# Patient Record
Sex: Female | Born: 1970 | Race: White | Hispanic: No | Marital: Single | Smoking: Never smoker
Health system: Southern US, Community
[De-identification: ages and names within clinical notes are randomized; demographics above are authoritative.]

## PROBLEM LIST (undated history)

## (undated) DIAGNOSIS — I10 Essential (primary) hypertension: Secondary | ICD-10-CM

## (undated) HISTORY — PX: HERNIA REPAIR: SHX51

---

## 1997-12-26 ENCOUNTER — Other Ambulatory Visit: Admission: RE | Admit: 1997-12-26 | Discharge: 1997-12-26 | Payer: Self-pay | Admitting: Obstetrics and Gynecology

## 1999-04-13 ENCOUNTER — Other Ambulatory Visit: Admission: RE | Admit: 1999-04-13 | Discharge: 1999-04-13 | Payer: Self-pay | Admitting: Obstetrics and Gynecology

## 1999-12-01 ENCOUNTER — Other Ambulatory Visit: Admission: RE | Admit: 1999-12-01 | Discharge: 1999-12-01 | Payer: Self-pay | Admitting: Obstetrics and Gynecology

## 2001-01-05 ENCOUNTER — Other Ambulatory Visit: Admission: RE | Admit: 2001-01-05 | Discharge: 2001-01-05 | Payer: Self-pay | Admitting: Obstetrics and Gynecology

## 2001-04-03 ENCOUNTER — Other Ambulatory Visit: Admission: RE | Admit: 2001-04-03 | Discharge: 2001-04-03 | Payer: Self-pay | Admitting: Gynecology

## 2002-06-19 ENCOUNTER — Other Ambulatory Visit: Admission: RE | Admit: 2002-06-19 | Discharge: 2002-06-19 | Payer: Self-pay | Admitting: Gynecology

## 2002-06-25 ENCOUNTER — Encounter: Admission: RE | Admit: 2002-06-25 | Discharge: 2002-09-23 | Payer: Self-pay | Admitting: Gynecology

## 2002-07-03 ENCOUNTER — Encounter (INDEPENDENT_AMBULATORY_CARE_PROVIDER_SITE_OTHER): Payer: Self-pay | Admitting: *Deleted

## 2002-07-03 ENCOUNTER — Ambulatory Visit (HOSPITAL_COMMUNITY): Admission: RE | Admit: 2002-07-03 | Discharge: 2002-07-03 | Payer: Self-pay | Admitting: Gynecology

## 2003-02-26 ENCOUNTER — Encounter: Admission: RE | Admit: 2003-02-26 | Discharge: 2003-05-27 | Payer: Self-pay | Admitting: Gynecology

## 2003-06-06 ENCOUNTER — Encounter (INDEPENDENT_AMBULATORY_CARE_PROVIDER_SITE_OTHER): Payer: Self-pay

## 2003-06-06 ENCOUNTER — Inpatient Hospital Stay (HOSPITAL_COMMUNITY): Admission: AD | Admit: 2003-06-06 | Discharge: 2003-06-09 | Payer: Self-pay | Admitting: Gynecology

## 2003-06-10 ENCOUNTER — Encounter: Admission: RE | Admit: 2003-06-10 | Discharge: 2003-07-10 | Payer: Self-pay | Admitting: Gynecology

## 2003-07-11 ENCOUNTER — Encounter: Admission: RE | Admit: 2003-07-11 | Discharge: 2003-08-10 | Payer: Self-pay | Admitting: Gynecology

## 2003-07-21 ENCOUNTER — Other Ambulatory Visit: Admission: RE | Admit: 2003-07-21 | Discharge: 2003-07-21 | Payer: Self-pay | Admitting: Gynecology

## 2003-09-10 ENCOUNTER — Encounter: Admission: RE | Admit: 2003-09-10 | Discharge: 2003-10-10 | Payer: Self-pay | Admitting: Gynecology

## 2003-11-28 ENCOUNTER — Encounter: Admission: RE | Admit: 2003-11-28 | Discharge: 2003-11-28 | Payer: Self-pay | Admitting: Family Medicine

## 2004-12-18 IMAGING — US US SOFT TISSUE HEAD/NECK
1 series · 14 of 25 positions shown · non-contrast
Comparison: none

CLINICAL DATA: Enlarged thyroid on physical exam. 
 THYROID ULTRASOUND ? 11/28/03 
 Thyroid gland by ultrasound assessment is upper limits of normal with the right lobe measuring 5.4 cm long by 1.2 cm AP by 1.7 cm wide, and the left lobe measuring 4.9 cm long by 1 cm AP by 1.8 cm wide.   The isthmus measures normally at 2 mm AP.  Thyroid echotexture is homogeneous with no focal lesions seen.  
 IMPRESSION
 Normal (thyroid gland upper limits of normal by ultrasound criteria).

[Series 1: unknown · 0.07mm/px · 14 of 27 slices shown]
[im 1/27]
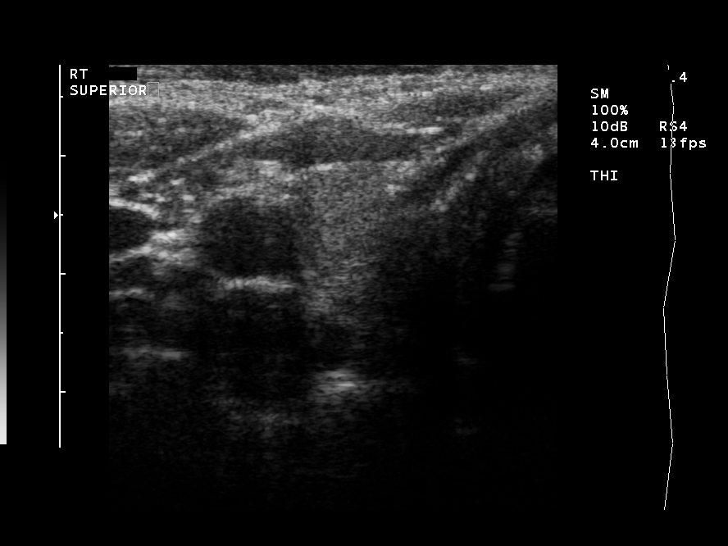
[im 3/27]
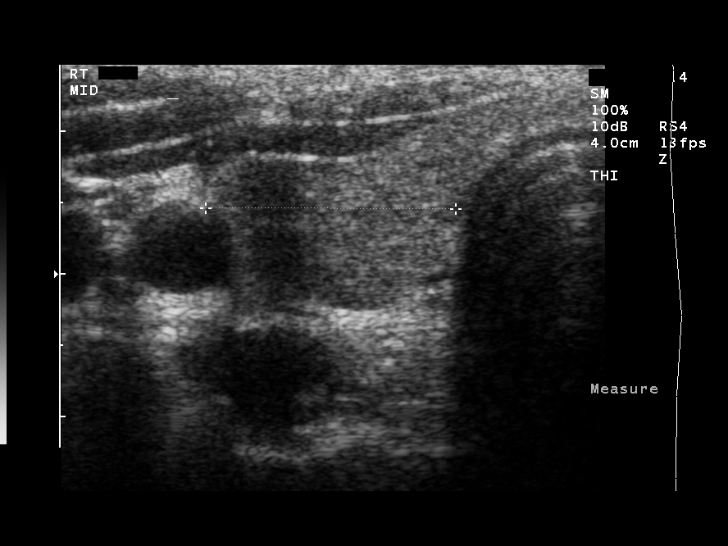
[im 5/27]
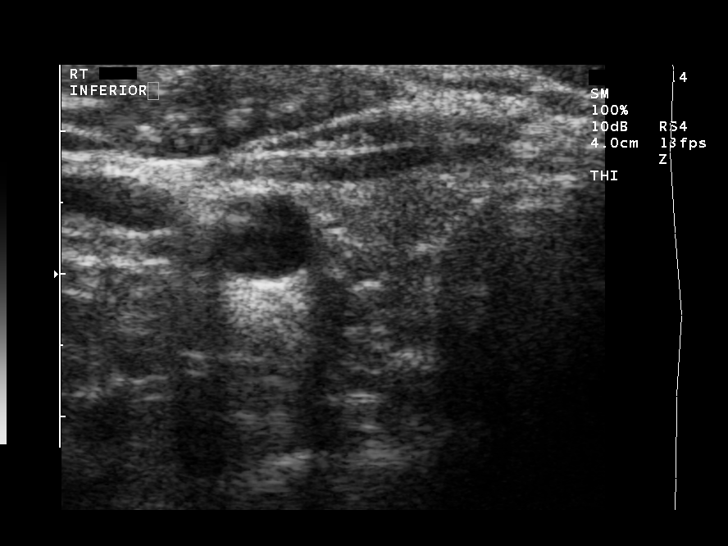
[im 7/27]
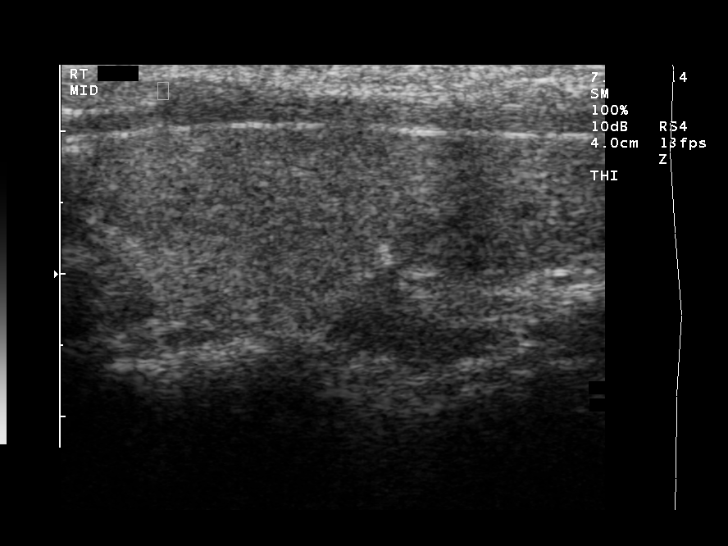
[im 9/27]
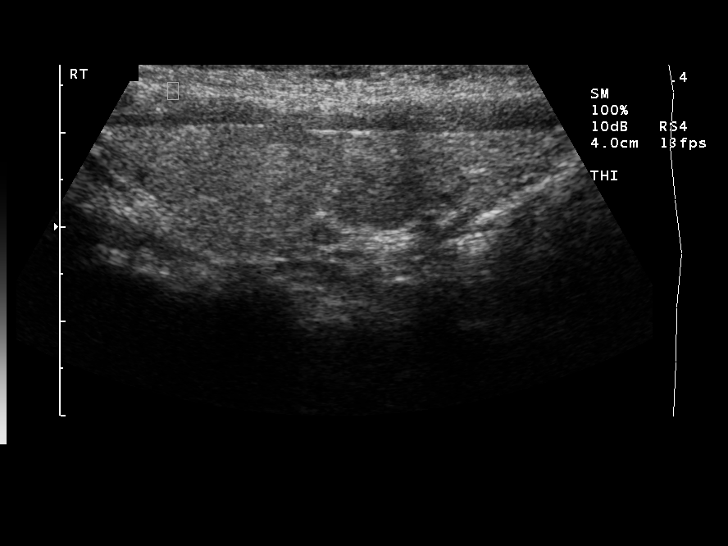
[im 10/27]
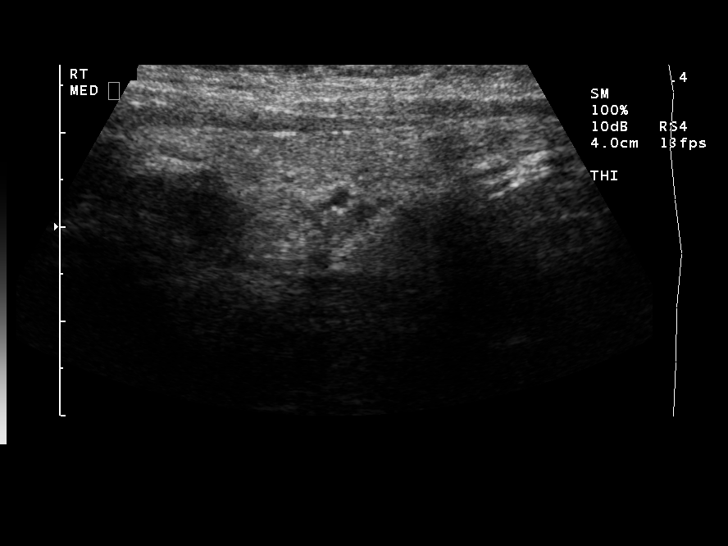
[im 12/27]
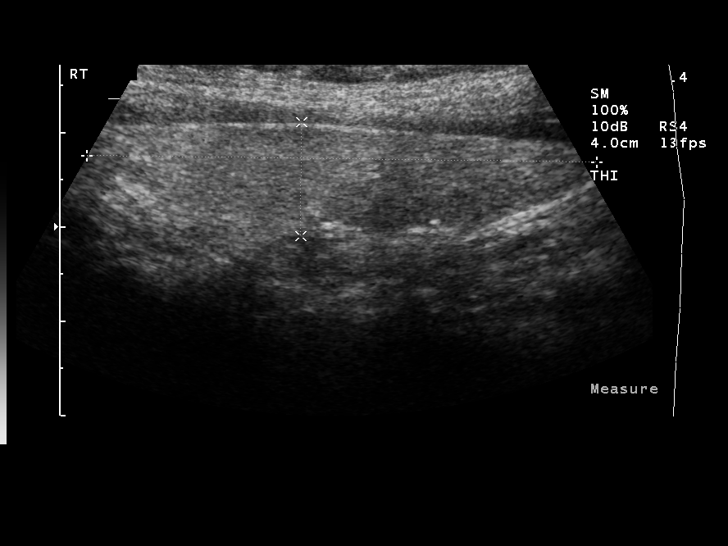
[im 15/27]
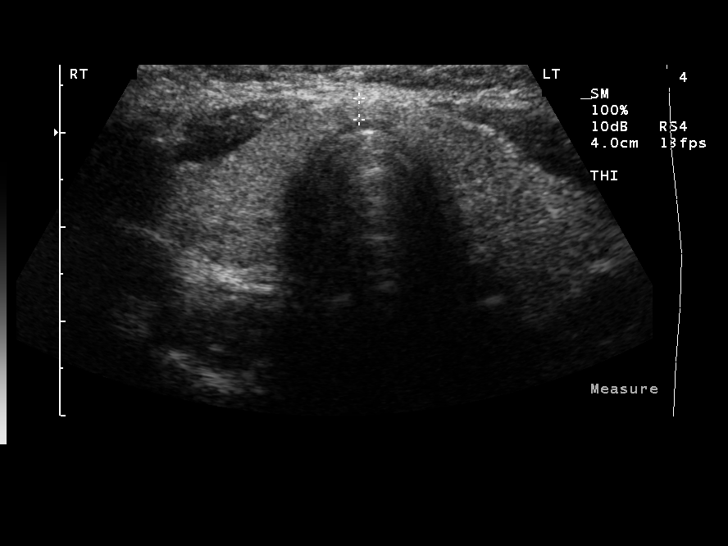
[im 17/27]
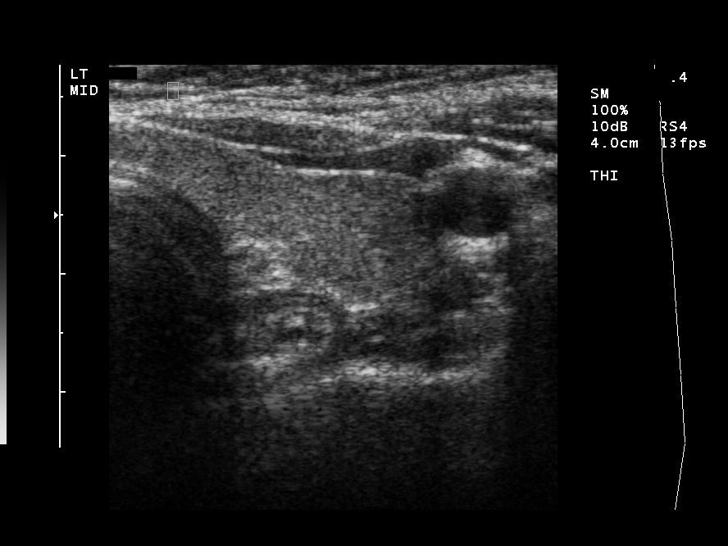
[im 18/27]
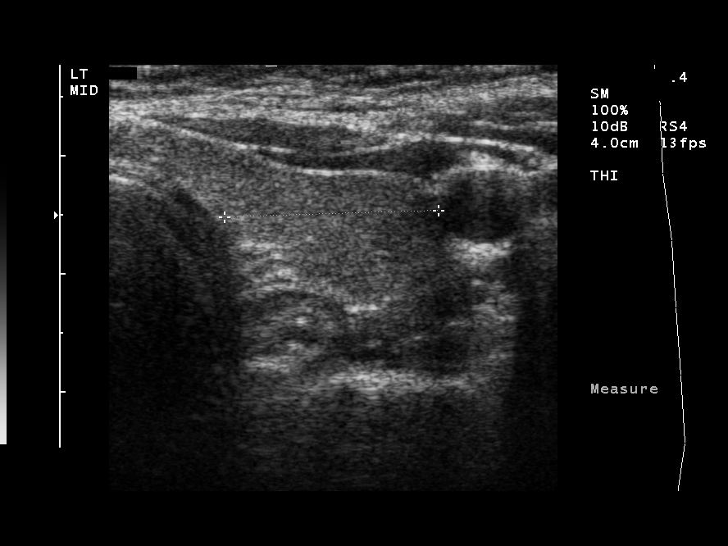
[im 20/27]
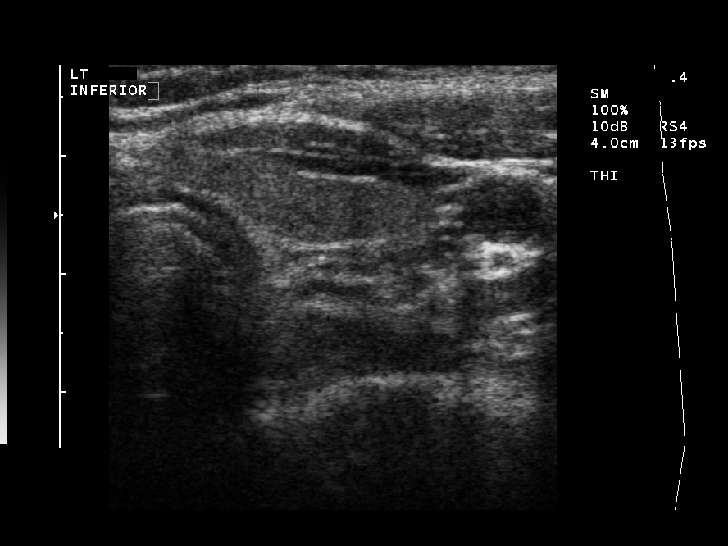
[im 22/27]
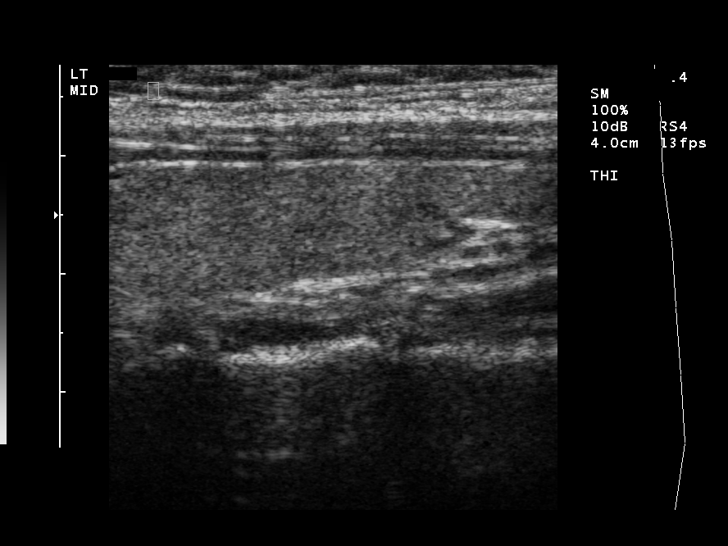
[im 24/27]
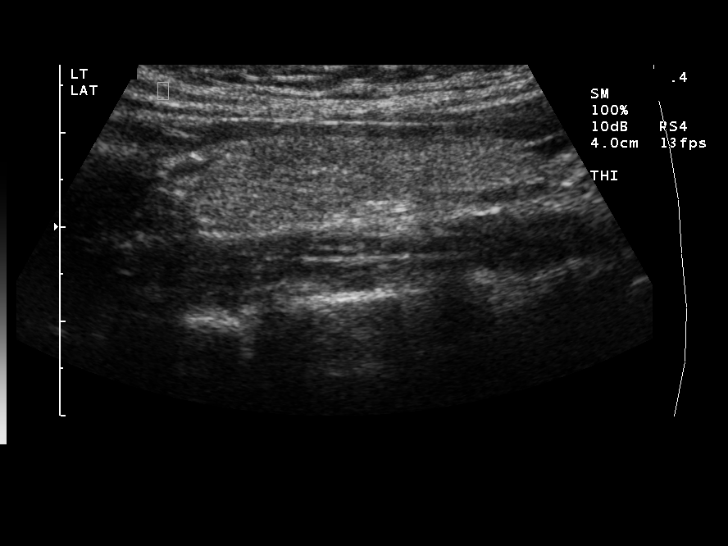
[im 27/27]
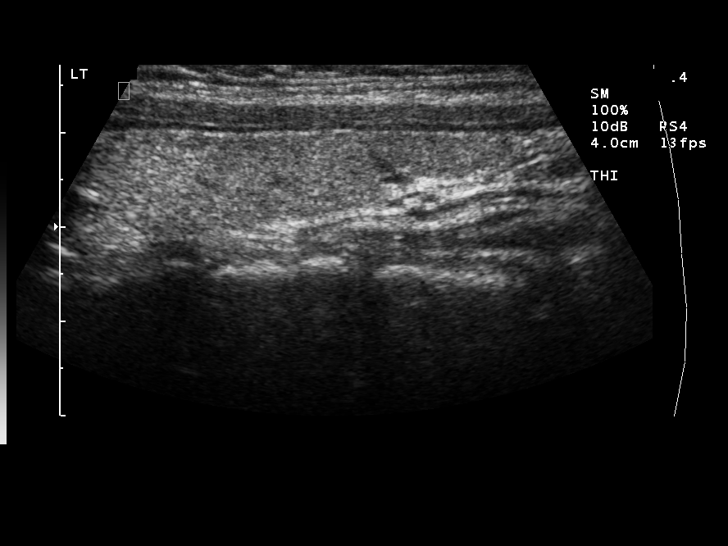

[14 of 25 positions shown; findings below may reference images not displayed]

## 2005-11-14 ENCOUNTER — Other Ambulatory Visit: Admission: RE | Admit: 2005-11-14 | Discharge: 2005-11-14 | Payer: Self-pay | Admitting: Gynecology

## 2006-01-04 ENCOUNTER — Ambulatory Visit: Payer: Self-pay | Admitting: Family Medicine

## 2006-04-20 ENCOUNTER — Other Ambulatory Visit: Admission: RE | Admit: 2006-04-20 | Discharge: 2006-04-20 | Payer: Self-pay | Admitting: Gynecology

## 2006-04-22 ENCOUNTER — Ambulatory Visit (HOSPITAL_COMMUNITY): Admission: AD | Admit: 2006-04-22 | Discharge: 2006-04-22 | Payer: Self-pay | Admitting: Gynecology

## 2006-04-22 ENCOUNTER — Encounter (INDEPENDENT_AMBULATORY_CARE_PROVIDER_SITE_OTHER): Payer: Self-pay | Admitting: Specialist

## 2007-03-20 ENCOUNTER — Ambulatory Visit (HOSPITAL_COMMUNITY): Admission: RE | Admit: 2007-03-20 | Discharge: 2007-03-20 | Payer: Self-pay | Admitting: Obstetrics & Gynecology

## 2007-05-13 IMAGING — US US OB TRANSVAGINAL MODIFY
1 series · 13 of 28 positions shown · non-contrast
Comparison: none

CLINICAL DATA: Positive pregnancy test.  Vaginal bleeding.  
OBSTETRICAL ULTRASOUND <14 WKS AND TRANSVAGINAL OB US:
TECHNIQUE: Both transabdominal and transvaginal ultrasound examinations were performed for complete evaluation of the gestation as well as the maternal uterus, adnexal regions, and pelvic cul-de-sac.

[Series 1: us ob transvaginal modify · 0.29mm/px · 13 of 54 slices shown]
[im 2/54]
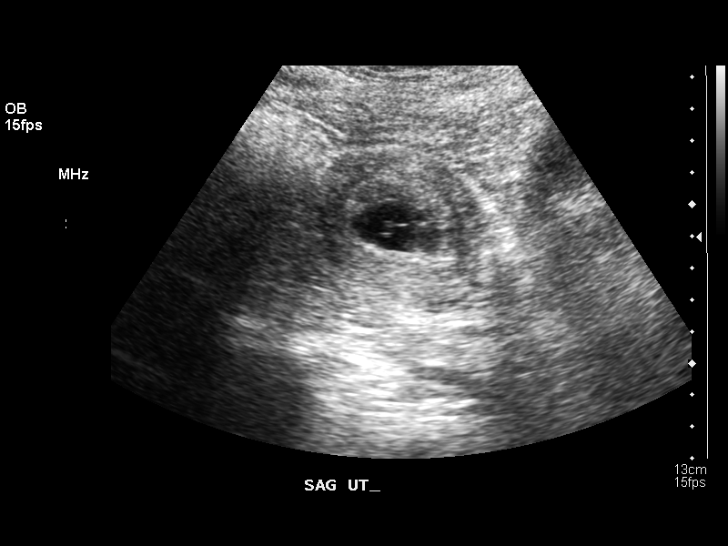
[im 6/54]
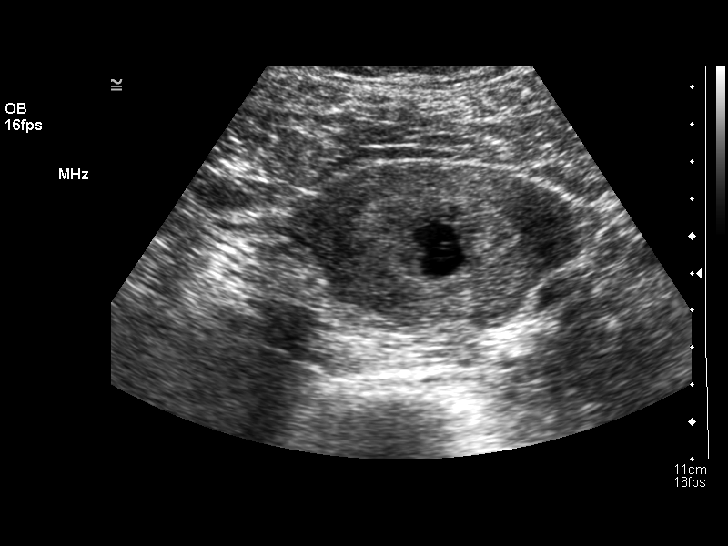
[im 10/54]
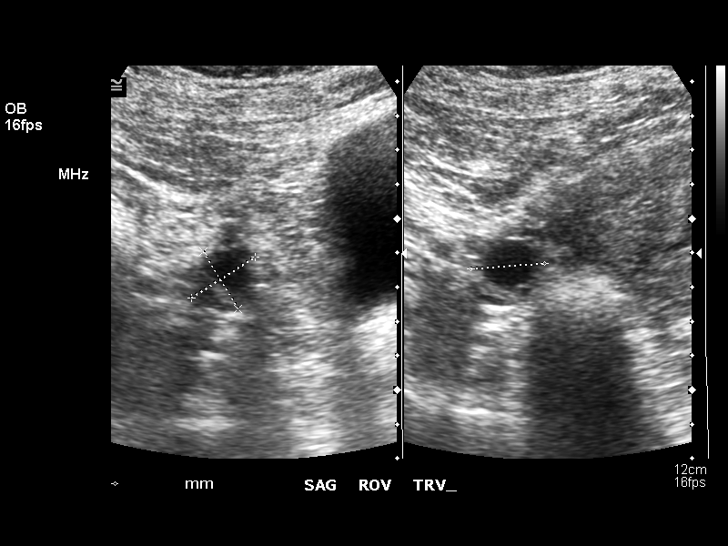
[im 14/54]
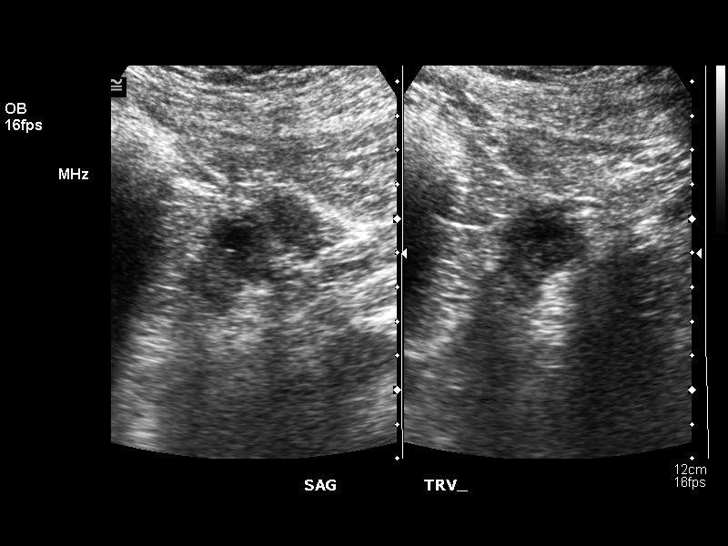
[im 18/54]
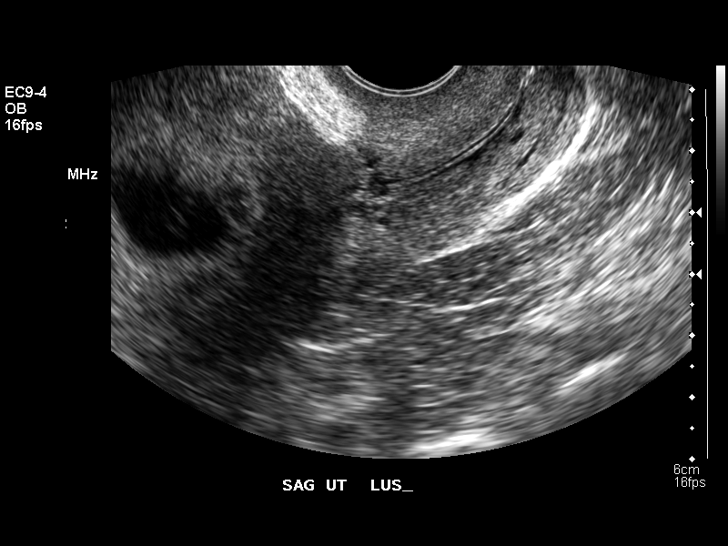
[im 22/54]
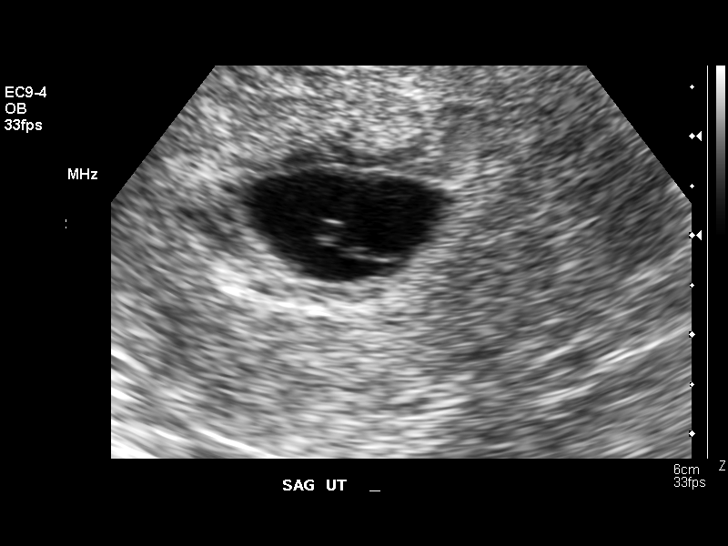
[im 28/54]
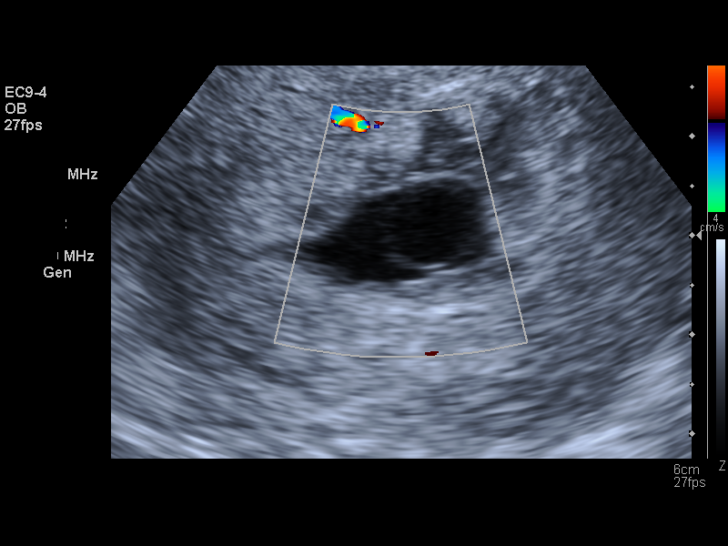
[im 32/54]
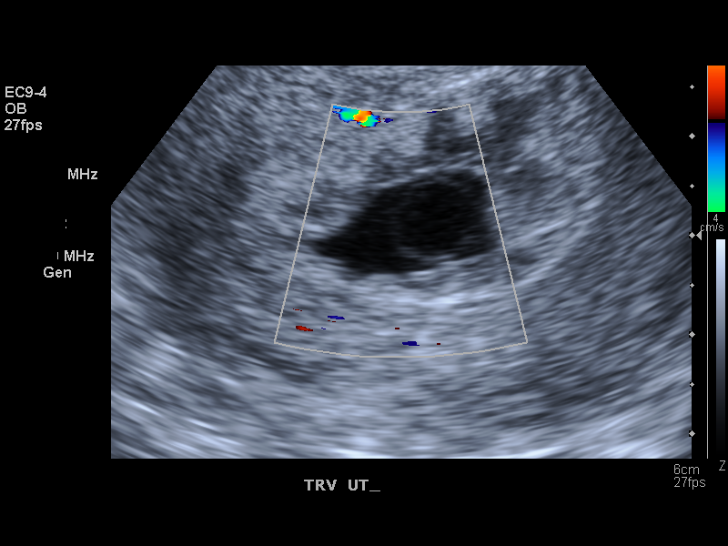
[im 36/54]
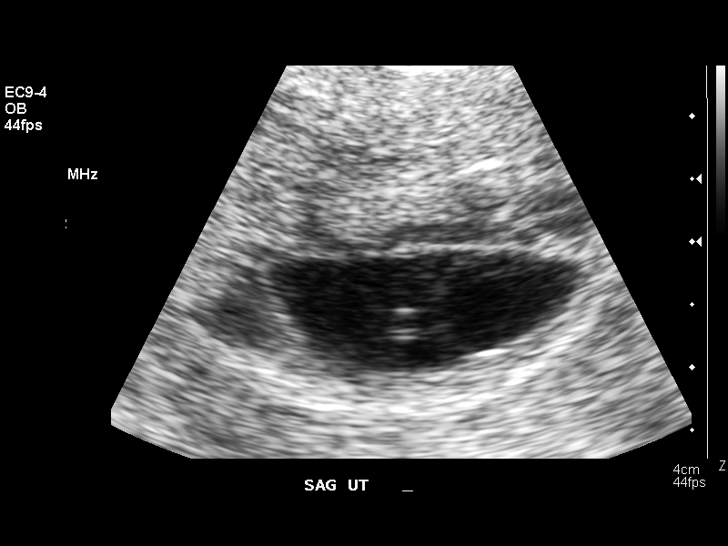
[im 40/54]
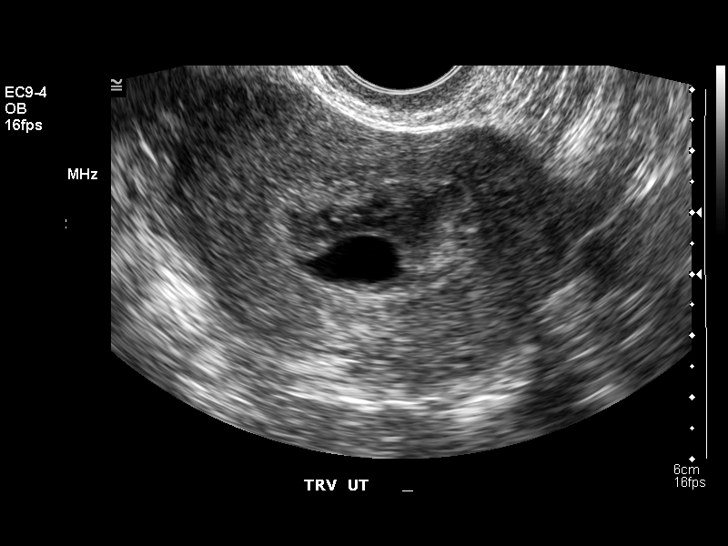
[im 44/54]
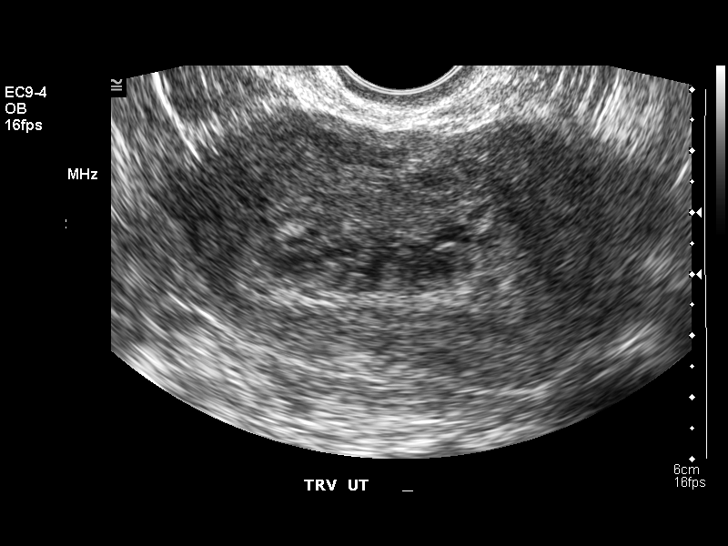
[im 48/54]
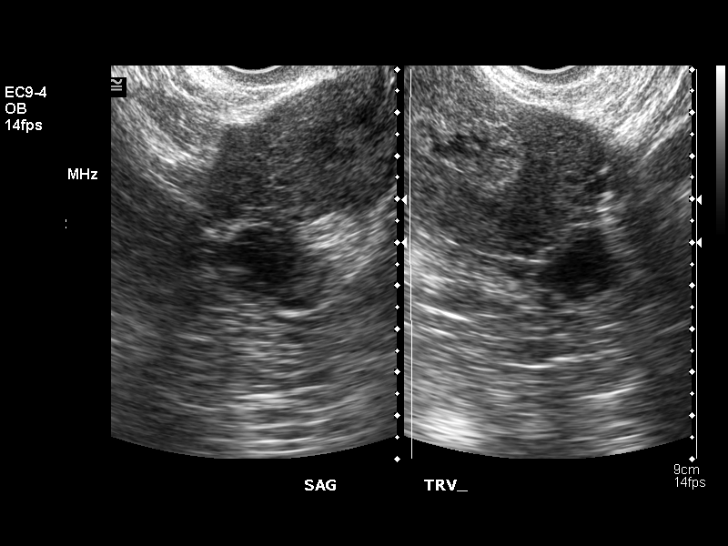
[im 52/54]
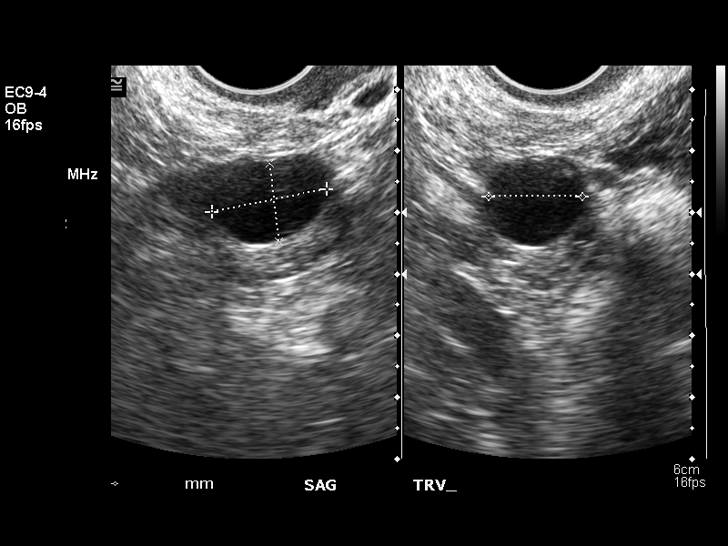

[13 of 28 positions shown; findings below may reference images not displayed]

FINDINGS: A fundal, endometrial fluid collection is identified.  It is round with an echogenic rim suggesting decidual reaction.  The mean sac diameter is 16.5 mm.  The sac however has an irregular contour.  No definite yolk sac or fetal pole is identified.  Septations are noted centrally.
No ectopic pregnancy is identified.  The ovaries are within normal limits.  Negative free fluid.  At the inferior aspect of the echogenic rim surrounding the fluid, there is a minimal area of hypoechogenicity which may represent a tiny subchorionic hemorrhage.
IMPRESSION: There is an abnormal appearing endometrial fluid collection with a mean sac diameter of 16.5 mm.  A yolk sac and fetal pole should be identified with a normal appearing gestational sac.  These findings most likely represent spontaneous abortion or blighted ovum.
Ectopic pregnancy is not entirely excluded.  Serial beta hCG levels are warranted.  Correlate clinically.

## 2007-05-27 ENCOUNTER — Inpatient Hospital Stay (HOSPITAL_COMMUNITY): Admission: AD | Admit: 2007-05-27 | Discharge: 2007-05-29 | Payer: Self-pay | Admitting: Obstetrics and Gynecology

## 2007-05-30 ENCOUNTER — Encounter: Admission: RE | Admit: 2007-05-30 | Discharge: 2007-06-29 | Payer: Self-pay | Admitting: Obstetrics and Gynecology

## 2009-07-30 ENCOUNTER — Ambulatory Visit (HOSPITAL_COMMUNITY): Admission: RE | Admit: 2009-07-30 | Discharge: 2009-07-30 | Payer: Self-pay | Admitting: Obstetrics and Gynecology

## 2011-01-20 LAB — CBC
HCT: 42.9 % (ref 36.0–46.0)
MCHC: 33.9 g/dL (ref 30.0–36.0)
MCV: 84.7 fL (ref 78.0–100.0)
Platelets: 207 10*3/uL (ref 150–400)
WBC: 7.1 10*3/uL (ref 4.0–10.5)

## 2011-03-01 NOTE — Op Note (Signed)
NAME:  SHANDIIN, EISENBEIS                 ACCOUNT NO.:  [id number]   MEDICAL RECORD NO.:  [id number]          PATIENT TYPE:  INP   LOCATION:  9104                          FACILITY:  [facility]   PHYSICIAN:  Kendra H. Tenny Craw, MD     DATE OF BIRTH:  Aug 29, 1971   DATE OF PROCEDURE:  05/27/2007  DATE OF DISCHARGE:                               OPERATIVE REPORT   PREOPERATIVE DIAGNOSIS:  1. 37 and 3-week intrauterine pregnancy.  2. Spontaneous rupture of membranes.  3. Labor.  4. Prior cesarean section, declines trial of labor.  5. A1 diabetes mellitus.   POSTOPERATIVE DIAGNOSIS:  1. 37 and 3-week intrauterine pregnancy.  2. Spontaneous rupture of membranes.  3. Labor.  4. Prior cesarean section, declines trial of labor.  5. A1 diabetes mellitus.   PROCEDURE:  Repeat low transverse cesarean section by Pfannenstiel's  skin incision.   SURGEON:  Freddrick March. Tenny Craw, M.D.   ASSISTANT:  None.   ANESTHESIA:  Spinal.   SPECIMENS:  Placenta.   DISPOSITION:  Pathology for disposal.   ESTIMATED BLOOD LOSS:  600 mL.   COMPLICATIONS:  None.   OPERATIVE FINDINGS:  Vigorous female infant in the vertex presentation  with Apgars 9/10. Fetal weight was not available at the time of this  dictation.   PROCEDURE:  Ms. Rossa is a gravida 7, para 1-0-5-1 at 31 and 3 weeks  estimated gestational age who presented to labor and delivery after  noticing leaking of fluid at 5:00 a.m. this morning and contractions.  She presented to labor and delivery and was noted to be spontaneously  ruptured and contracting every 3-5 minutes. Cervix at the time of  presentation was 1, complete and zero station and given that she had had  a prior cesarean section.  She wished to proceed with a repeat cesarean  section.  Following the appropriate informed consent the patient was  brought to the operating room where spinal anesthesia was administered  and found to be adequate.  She was placed in the dorsal supine position  in a  leftward tilt, prepped and draped in the normal sterile fashion.  A  scalpel was used to make a Pfannenstiel's skin incision along her prior  Pfannenstiel incision and this incision was carried down through the  underlying layers of soft tissue to the fascia.  The fascia was incised  in the midline.  The fascial incision was extended laterally with Mayo  scissors.  The superior aspect of the fascial incision was tented up  with Kocher clamps x2.  The underlying rectus muscle was dissected off  sharply with the electrocautery unit and the same procedure was repeated  on the inferior aspect of the fascial incision.  The rectus muscles were  separated in the midline.  The abdominal peritoneum was identified,  entered bluntly and the incision was extended superiorly and inferiorly  with good visualization of the bladder. At this point the [facility]  retractor was then inserted and with care noted to be sure that no bowel  was underneath the inner ring and the retractor was then deployed  and  the vesicouterine peritoneum was identified, tented up and entered  sharply with Metzenbaum scissors.  The incision was extended laterally  and the bladder flap was created digitally.  A scalpel was then used to  make a low transverse incision on the uterus which was extended  laterally with blunt dissection.  The fetal vertex was identified,  brought up through the incision.  The infant was bulb suctioned on the  operative field and the body was then delivered. The cord was clamped  and cut and the infant was passed to the waiting pediatricians.  The  placenta was then manually extracted. The uterus was exteriorized,  cleared of all clot and debris.  The uterine incision was repaired with  #1 chromic in a running locked fashion.  A second imbricating layer was  then placed and several figure-of-eight sutures were used to achieve  hemostasis of the uterine incision. The uterus was then returned to the   abdominal cavity.  The Alexis retractor was disassembled and the  abdominal cavity was cleared of all clot and debris.  The uterine  incision was reinspected and found to be hemostatic.  The abdominal  peritoneum was then closed with 2-0 Vicryl in a running fashion and the  rectus muscles were reapproximated in the midline with two figure-of-  eight 2-0 Vicryl sutures and the fascia was closed with a looped PDS in  a running fashion and the skin was closed with staples.  All sponge,  lap, needle counts and instrument counts were correct x2.  The patient  tolerated the procedure well and was brought to the recovery room in  stable condition following the procedure.      Freddrick March. Tenny Craw, MD  Electronically Signed     KHR/MEDQ  D:  05/27/2007  T:  05/27/2007  Job:  [id number]

## 2011-03-01 NOTE — Discharge Summary (Signed)
[name], Sonia Diaz                 ACCOUNT NO.:  [id number]   MEDICAL RECORD NO.:  [id number]          PATIENT TYPE:  INP   LOCATION:  9104                          FACILITY:  [facility]   PHYSICIAN:  Carrington Clamp, M.D. DATE OF BIRTH:  08/24/1971   DATE OF ADMISSION:  05/27/2007  DATE OF DISCHARGE:  05/29/2007                               DISCHARGE SUMMARY   FINAL DIAGNOSES:  1. Intrauterine pregnancy at 37-3/7 weeks' gestation.  2. Spontaneous rupture of membranes.  3. Active labor.  4. History of prior cesarean section.  The patient desires repeat      cesarean section and declines trial of labor.  5. Type A1 diabetes mellitus.   PROCEDURE:  Repeat low transverse cesarean section.  Surgeon:  Dr.  Waynard Reeds Assistant:  None.  Complications:  None.   This 40 year old G7, P1-0-5-1 presents at 37-3/7 weeks' gestation with  spontaneous rupture of membranes in active labor.  The patient's  antepartum course up to this point had been complicated by advanced  maternal age.  The patient did have a first trimester screen and AFP,  which both returned normal.  The patient also had a history of a  cesarean section with her first pregnancy and expressed her desires for  repeat with this pregnancy as well.  The patient had also received  RhoGAM at 28 weeks secondary to Rh negative status, and the does have  diet-controlled gestational diabetes mellitus.  The patient was admitted  at this time, was noted to be grossly ruptured.  At this point the  patient was having some elevated blood pressures.  PIH labs were checked  and the patient's PIH panel was within normal limits.  She was taken to  the operating room on for May 27, 2007, by Dr. Waynard Reeds, where a  repeat low transverse cesarean section was performed with the delivery  of a 7 pound 4 ounce female infant with Apgars of 9 and 10.  Delivery went  without complications.  The patient's postoperative course was benign  without any  significant fevers.  The patient was started on some iron  postoperatively secondary to some mild postoperative anemia.  She did  want her little boy circumcised before discharge and was felt ready for  discharge on postoperative day #2.  The patient did receive RhoGAM per  protocol before discharge.  She was sent home on a regular diet, told to  decrease activities, told to continue her vitamins, and was given  Percocet one to two every 4-6 hours as needed for the pain.  Was to  return to our office on the 14th for her staple removal.  Instructions  and precautions were reviewed with the patient.   LABS ON DISCHARGE:  The patient had a hemoglobin of 10.6, white blood  cell count of 8.9, platelets of 151,000 and, as I had mentioned before,  a normal PIH panel.      Leilani Able, P.A.-C.      Carrington Clamp, M.D.  Electronically Signed    MB/MEDQ  D:  06/29/2007  T:  06/30/2007  Job:  [id number] 

## 2011-03-04 NOTE — Op Note (Signed)
NAME:  Sonia Diaz, Sonia Diaz                 ACCOUNT NO.:  [id number]   MEDICAL RECORD NO.:  [id number]          PATIENT TYPE:  AMB   LOCATION:  SDC                           FACILITY:  [facility]   PHYSICIAN:  Juan H. Lily Peer, M.D.DATE OF BIRTH:  1971-03-07   DATE OF PROCEDURE:  04/22/2006  DATE OF DISCHARGE:                                 OPERATIVE REPORT   INDICATIONS FOR OPERATION:  This is a 40 year old gravida 6, para 1, AB 4,  12-1/2 weeks estimated gestational age with decreasing quantitative beta  hCG, vaginal bleeding and ultrasound demonstrated an irregular gestational  sac consistent with six weeks  with evidence of a missed abortion versus  incomplete abortion.   PREOPERATIVE DIAGNOSIS:  First trimester incomplete abortion.   POSTOPERATIVE DIAGNOSIS:  First trimester incomplete abortion.   SURGEON:  Juan H. Lily Peer, M.D.   PROCEDURE PERFORMED:  Dilatation and evacuation.   ANESTHESIA:  Intravenous sedation along with a paracervical block.   DESCRIPTION OF OPERATION:  After the patient was adequately counseled, she  was taken to the operating room where she underwent intravenous sedation and  a paracervical block with 2% Xylocaine with 1:100,000 epinephrine for a  total of 10 cc.  The patient's abdomen, vagina and perineum had been prepped  and draped in the usual sterile fashion.  Red rubber Roxan Hockey had been  inserted in an effort to evacuate the bladder of its contents and  examination demonstrated a 6-day week size anteverted uterus with no  palpable adnexal masses.  Single tooth tenaculum had been placed on the  anterior cervical lip and the cervix required no dilatation and the uterus  sounded to 10 cm.  A 7-mm suction curet was introduced into the intrauterine  cavity for removal of the products of conception and this was interchanged  with a serrated curet to completely evacuate the uterus of its contents.  The patient was given 0.2 mg IM as a uterotonic agent after  completion of  the procedure.  She had received Ancef 2 g IV and preoperatively she had  received 300 mcg of RhoGAM due to the fact that she is A negative.  IV fluid  consisted of a liter of LR.  She was transferred to recovery with stable  vital signs.      Juan H. Lily Peer, M.D.  Electronically Signed     JHF/MEDQ  D:  04/22/2006  T:  04/22/2006  Job:  [id number]

## 2011-03-04 NOTE — Discharge Summary (Signed)
   NAME:  Sonia Diaz, Sonia Diaz                           ACCOUNT NO.:  [id number]   MEDICAL RECORD NO.:  [id number]                   PATIENT TYPE:  INP   LOCATION:  9109                                 FACILITY:  [facility]   PHYSICIAN:  Timothy P. Fontaine, M.D.           DATE OF BIRTH:  April 27, 1971   DATE OF ADMISSION:  06/06/2003  DATE OF DISCHARGE:  06/09/2003                                 DISCHARGE SUMMARY   DISCHARGE DIAGNOSES:  1. Intrauterine pregnancy at term with spontaneous rupture of membranes.  2. Nonreassuring fetal tracing.  3. Failure to progress.  4. Gestational diabetes, diet-controlled.   PROCEDURE:  Primary cesarean section.   HISTORY OF PRESENT ILLNESS:  A 40 year old gravida 4, para 0, AB 3, 38 weeks  with spontaneous rupture of membranes with clear fluid with contractions who  was augmented with Pitocin.  She had nonreassuring fetal heart rate and  failure to progress.  Primary C-section with a delivery of a viable female  with Apgars of 9 and 9, birth weight of 7 pounds 3 ounces.  She did have a  nuchal cord times two.   LABORATORIES:  Rh is A negative.  Antibody negative.  Serology nonreactive.  Rubella titer positive.  Hepatitis nonreactive.  HIV nonreactive.  Group B  strep negative.   HOSPITAL COURSE AND TREATMENT:  The patient presented on June 06, 2003,  with spontaneous rupture of membranes, in labor.  She contracted with  augmenting labor and delivered a viable female, Apgars of 9 and 9, and birth  weight of 7 pounds 3 ounces.  Postpartum, she remained afebrile.  No  difficulty voiding.  She was discharged in satisfactory condition on her  third postop day.  She did not receive RhoGAM.  The baby was Rh negative as  well.   DISCHARGE LABORATORIES:  White count 13.8, hemoglobin 11.1, hematocrit 32.1,  platelets 146,000.   DISPOSITION:  She was discharged to home with instructions to follow up in  six weeks or as needed.  Continue prenatal vitamins and  iron.  GGA discharge  booklet was given.  A prescription was Tylox one to two q.4-6h. was given.  Staples were removed and Steri-Strips applied.     Davonna Belling. Young, N.P.                      Timothy P. Audie Box, M.D.    [facility]  D:  06/25/2003  T:  06/25/2003  Job:  [id number]

## 2011-03-04 NOTE — Op Note (Signed)
NAME:  Sonia Diaz, BOTTCHER                           ACCOUNT NO.:  [id number]   MEDICAL RECORD NO.:  [id number]                   PATIENT TYPE:  AMB   LOCATION:  SDC                                  [facility]   PHYSICIAN:  Juan H. Lily Peer, M.D.             DATE OF BIRTH:  1971-07-19   DATE OF PROCEDURE:  07/03/2002  DATE OF DISCHARGE:                                 OPERATIVE REPORT   INDICATIONS FOR OPERATION:  A 40 year old gravida 3, para 0, AB 2, now AB 3,  current 12 weeks by menstrual period approximately 8 to 10 weeks size by  ultrasound demonstrated a nonviable intrauterine pregnancy consistent with a  missed abortion.   PREOPERATIVE DIAGNOSES:  1. First trimester missed abortion.  2. Recurrent pregnancy  losses.   POSTOPERATIVE DIAGNOSES:  1. First trimester missed abortion.  2. Recurrent pregnancy losses.   ANESTHESIA:  MAC along with a paracervical block consisting of 2% Xylocaine  with 1:100,000 epinephrine.   SURGEON:  Juan H. Lily Peer, M.D.   PROCEDURE PERFORMED:  Dilatation and evacuation.   COMPLICATIONS:  None.   DESCRIPTION OF OPERATION:  After the patient was adequately counseled she  underwent MAC anesthesia.  She was placed in a low lithotomy position.  The  vagina and perineum were prepped and draped in the usual sterile fashion.  The examination demonstrated the uterus felt to be anteverted at 10 weeks  size.  A rubber _____________ was inserted into the bladder to evacuate its  contents of approximately 75 cc.  A speculum was inserted into the vaginal  vault.  The cervix was cleansed with Betadine solution.  Of note, the  patient did receive 2 grams of Cefotan prophylactically.  2% Lidocaine was  infiltrated into the cervical stroma at the 2, 4, 8 and 10 o'clock position  and the cervix was then fit  for a total of 10 cc.  The cervix was then  serially dilated to a 10 mm size thus allowing a 10-mm curet to be inserted  into the uterine cavity to  remove the products of conception.  This was  interchanged with a serrated curet to remove any additional parts of  conception once again with suction curettage.  To complete evacuation of the  intrauterine cavity the single-tooth tenaculum previous was placed in the  anterior cervical lip for manipulation was removed.  After ascertaining  adequate hemostasis the patient was transferred to the recovery room with  stable vital signs.  20 units of Pitocin were diluted in liter of lactated  Ringer's and she received 30 mm of Toradol and went to the recovery room.  An aliquot of the tissue was submitted for chromosomal studies in  patient  due to the fact that she is Rh negative.  Will receive RhoGAM 300 mcg IM in  the recovery room.  Juan H. Lily Peer, M.D.    JHF/MEDQ  D:  07/03/2002  T:  07/03/2002  Job:  [id number]

## 2011-03-04 NOTE — Op Note (Signed)
NAME:  Sonia Diaz, Sonia Diaz                           ACCOUNT NO.:  [id number]   MEDICAL RECORD NO.:  [id number]                   PATIENT TYPE:  INP   LOCATION:  9173                                 [facility]ACILITY:  [facility]   PHYSICIAN:  Timothy P. Fontaine, M.D.           DATE OF BIRTH:  02-03-1971   DATE OF PROCEDURE:  06/06/2003  DATE OF DISCHARGE:                                 OPERATIVE REPORT   PREOPERATIVE DIAGNOSES:  1. Pregnancy at term.  2. Nonreassuring fetal tracing.  3. Failure to progress.  4. Gestational diabetes, diet controlled.   POSTOPERATIVE DIAGNOSES:  1. Pregnancy at term.  2. Nonreassuring fetal tracing.  3. Failure to progress.  4. Gestational diabetes, diet controlled.  5. 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   OPERATION/PROCEDURE:  1. Primary low transverse cervical Cesarean section.  2. Excision of left hydatid cyst x2.   SURGEON:  Timothy P. Fontaine, M.D.   ASSISTANT:  Scrub technician.   ANESTHESIA:  Epidural.   ESTIMATED BLOOD LOSS:  Less than 500 mL.   COMPLICATIONS:  None.   SPECIMENS:  1. Samples of cord blood.  2. Placenta.  3. Hydatid cyst of Morgagni x2, left.   FINDINGS:  At 1830 normal female, Apgars 9 and 9, weight 7 pounds 3 ounces.  Pelvic anatomy was noted to be normal with the exception of two small  hydatid cysts at the distal portion of the fallopian tubes which were  twisted and these were excised.   DESCRIPTION OF PROCEDURE:  The patient was taken to the operating room,  underwent epidural dosing of her catheter, received abdominal preparation  with Betadine solution, draped in the usual fashion. Foley catheter had been  previously placed on labor and delivery.   After assuring adequate anesthesia, the abdomen was sharply entered through  a Pfannenstiel incision assuring adequate hemostasis at all levels.  The  bladder flap was sharply and bluntly developed without difficulty and the  uterus was  sharply entered in the lower uterine segment and bluntly extended  laterally.  The fluid was noted to be clear.  The infant's head was  delivered through the incision, nares were mildly suctioned.  A nuchal cord  tight x2 was reduced.  The rest of the infant was delivered, the cord doubly  clamped and cut and infant handed to the pediatrics in attendance.  Samples  of cord blood were obtained.  Placenta was then spontaneously extruded,  noted to be intact and was sent to pathology.  The uterus was exteriorized  and the endometrial cavity explored with a sponge to remove all placental  membrane fragments.  The patient received 1 g of cephazolin antibiotic  prophylaxis after cord clamping.  The uterine incision was then closed in  two layers using 0 Vicryl suture, first in a running interlocking stitch  followed by an imbricating stitch.   Adnexal evaluation showed  normal-appearing ovaries and fallopian tubes  bilaterally.  There were two small hydatid cysts of Morgagni on long stalks  at the distal end of the left fallopian tube which were twisted upon  themselves and these were excised with electrocautery.  Care was taken to  avoid being near the fimbriated portion of the tube.   Uterus was then returned to the abdomen which was copiously irrigated,  showing adequate hemostasis.  The anterior fascial layer was then  reapproximated with using 0 Vicryl sutures starting at the angle and meeting  in the middle in a running stitch.  Subcutaneous tissues were irrigated.  Hemostasis was achieved with electrocautery and the skin was reapproximated  with staples.  Sterile dressing was applied.  The patient was taken to the  recovery room in good condition having tolerated the procedure well.                                               Timothy P. Audie Box, M.D.    TPF/MEDQ  D:  06/06/2003  T:  06/07/2003  Job:  [id number]

## 2011-03-04 NOTE — H&P (Signed)
NAME:  Sonia Diaz, Sonia Diaz                           ACCOUNT NO.:  [id number]   MEDICAL RECORD NO.:  [id number]                   PATIENT TYPE:  AMB   LOCATION:  SDC                                  FACILITY:  [facility]   PHYSICIAN:  Juan H. Lily Peer, M.D.             DATE OF BIRTH:  1970-10-19   DATE OF ADMISSION:  DATE OF DISCHARGE:                                HISTORY & PHYSICAL   CHIEF COMPLAINT:  Missed AB.   HISTORY OF PRESENT ILLNESS:  The patient is a 40 year old gravida 3, para 0,  AB2 who is currently 12 weeks into her pregnancy.  She was seen in the  office on September 10 and had an ultrasound due to the fact that her size  measured less than dates and fetal heart tones were not auscultated.  The  ultrasound demonstrated by last menstrual period she would have been 11  weeks and 2 days but there was no heart activity visualized.  The yolk sac  measured 4.9 mm and was irregular shape, crown limp, and the amniotic fluid  was normal.  With these findings patient was given the option to wait to see  if she had a spontaneous AB or proceed by next week with a D&C.  She decided  she wanted to make sure that the fetus, indeed, had not developed and was  coming to the office later today, September 16, to reconfirm that there is  no fetal viability.  If not, she is scheduled for [facility] tomorrow at [facility].   PAST MEDICAL HISTORY:  The patient is a RhoGAM candidate.  She is A- and  husband is A+.  She has type 2 diabetes diagnosed this pregnancy.  She has a  narrow pelvis.  She has had one elective AB 1991 and had a spontaneous AB in  1992 and now missed AB with this pregnancy.   ALLERGIES:  BENADRYL as well as SULFA.   PAST SURGICAL HISTORY:  She has had a D&C 1991.   REVIEW OF SYMPTOMS:  See [place] form.   PHYSICAL EXAMINATION:  GENERAL:  Well-developed, well-nourished female.  VITAL SIGNS:  Average weight of approximately 178-180 pounds.  HEENT:  Unremarkable.  NECK:   Supple.  Trachea midline.  No carotid bruits.  No thyromegaly.  LUNGS:  Clear to auscultation without rhonchi or wheezes.  HEART:  Regular rate and rhythm.  No murmurs or gallops.  RECTAL:  Not done.  ABDOMEN:  Soft, nontender without rebound or guarding.  PELVIC:  Cervix was closed.  Some brownish discharge in the vaginal vault,  but no active bleeding.  Uterus approximately 10 weeks size.  No palpable  adnexal masses.  RECTAL:  Not done.   PRENATAL LABORATORIES:  A- blood type.  Negative antibody screen.  VDRL was  nonreactive.  Hepatitis B surface antigen, HIV were negative.  Rubella with  evidence of immunity.  ASSESSMENT:  A 40 year old gravida 3, para 2 at [redacted] weeks gestation today  with evidence of a missed abortion.  Will have a second ultrasound today in  the office.  If indeed there is no fetal viability, will proceed with a D&C.  Risks, benefits, pros and cons such as infection, bleeding, perforation of  the uterus, instrumentation, the need for blood transfusion or blood  products with its potential risk for anaphylactic reaction, hepatitis and  AIDS were also discussed.  Due to the fact that this is her second  spontaneous miscarriage, will obtain an antiphospholipid antibody and lupus  anticoagulant as well and submit the tissue for histologic evaluation.  All  the above was discussed with the patient.  Will follow accordingly.   PLAN:  The patient is scheduled for D&E tomorrow September 17 at [facility].                                               [place]. Lily Peer, M.D.    JHF/MEDQ  D:  07/02/2002  T:  07/02/2002  Job:  [id number]

## 2011-08-01 LAB — COMPREHENSIVE METABOLIC PANEL
CO2: 22
Calcium: 8.9
Creatinine, Ser: 0.44
GFR calc Af Amer: >60
GFR calc non Af Amer: >60
Glucose, Bld: 103 — ABNORMAL HIGH

## 2011-08-01 LAB — CBC
Hemoglobin: 12.3
MCHC: 33.7
RBC: 3.69 — ABNORMAL LOW
RDW: 13.6
WBC: 8.9

## 2011-08-01 LAB — LACTATE DEHYDROGENASE: LDH: 123

## 2011-08-04 LAB — CBC
HCT: 33.9 — ABNORMAL LOW
MCV: 84.4
RBC: 4.02
WBC: 9.1

## 2011-08-04 LAB — RH IMMUNE GLOBULIN WORKUP (NOT WOMEN'S HOSP): Antibody Screen: NEGATIVE

## 2013-07-11 DIAGNOSIS — N898 Other specified noninflammatory disorders of vagina: Secondary | ICD-10-CM | POA: Insufficient documentation

## 2013-11-16 ENCOUNTER — Emergency Department (HOSPITAL_BASED_OUTPATIENT_CLINIC_OR_DEPARTMENT_OTHER)
Admission: EM | Admit: 2013-11-16 | Discharge: 2013-11-17 | Disposition: A | Discharge status: Home / Self Care | Payer: Self-pay | Attending: Emergency Medicine | Admitting: Emergency Medicine

## 2013-11-16 ENCOUNTER — Encounter (HOSPITAL_BASED_OUTPATIENT_CLINIC_OR_DEPARTMENT_OTHER): Payer: Self-pay | Admitting: Emergency Medicine

## 2013-11-16 DIAGNOSIS — R000 Tachycardia, unspecified: Secondary | ICD-10-CM | POA: Insufficient documentation

## 2013-11-16 DIAGNOSIS — J111 Influenza due to unidentified influenza virus with other respiratory manifestations: Secondary | ICD-10-CM | POA: Insufficient documentation

## 2013-11-16 MED ORDER — SODIUM CHLORIDE 0.9 % IV BOLUS (SEPSIS)
1000.0000 mL | Freq: Once | INTRAVENOUS | Status: AC
  Administered 2013-11-16: 1000 mL via INTRAVENOUS

## 2013-11-16 NOTE — ED Notes (Signed)
Reports fever, headache, body aches x four days.  Fever 102-104 at home.  No other physical symptoms.

## 2013-11-16 NOTE — Discharge Instructions (Signed)

## 2013-11-16 NOTE — ED Provider Notes (Signed)
CSN: [id number]     Arrival date & time 11/16/13  2147 History   First MD Initiated Contact with Patient 11/16/13 2213     Chief Complaint  Patient presents with  . Fever   (Consider location/radiation/quality/duration/timing/severity/associated sxs/prior Treatment) Patient is a 43 y.o. female presenting with fever. The history is provided by the patient. No language interpreter was used.  Fever Temp source:  Oral Associated symptoms: myalgias   Associated symptoms: no chest pain, no chills, no confusion, no congestion, no cough, no dysuria, no nausea and no vomiting   Associated symptoms comment:  Fever, headache, body aches and rigors for the past 4 days. No other symptoms, specifically, no N, V, D, cough or sore throat. She states she is the only member of the family that is sick.    History reviewed. No pertinent past medical history. History reviewed. No pertinent past surgical history. No family history on file. History  Substance Use Topics  . Smoking status: Never Smoker   . Smokeless tobacco: Not on file  . Alcohol Use: No   OB History   Grav Para Term Preterm Abortions TAB SAB Ect Mult Living                 Review of Systems  Constitutional: Positive for fever. Negative for chills.  HENT: Negative.  Negative for congestion and sinus pressure.   Respiratory: Negative.  Negative for cough and shortness of breath.   Cardiovascular: Negative.  Negative for chest pain.  Gastrointestinal: Negative.  Negative for nausea, vomiting and abdominal pain.  Genitourinary: Negative for dysuria.  Musculoskeletal: Positive for myalgias.  Skin: Negative.   Neurological: Negative.   Psychiatric/Behavioral: Negative for confusion.    Allergies  Benadryl and Sulfa antibiotics  Home Medications  No current outpatient prescriptions on file. BP 142/84  Pulse 107  Temp(Src) 99.8 F (37.7 C) (Oral)  Resp 18  Ht 5\' 4"  (1.626 m)  Wt 157 lb (71.215 kg)  BMI 26.94 kg/m2  SpO2  100%  LMP 11/05/2013 Physical Exam  Constitutional: She appears well-developed and well-nourished.  HENT:  Head: Normocephalic.  Mouth/Throat: Mucous membranes are dry.  Neck: Normal range of motion. Neck supple.  Cardiovascular: Regular rhythm.  Tachycardia present.   Pulmonary/Chest: Effort normal and breath sounds normal.  Abdominal: Soft. Bowel sounds are normal. There is no tenderness. There is no rebound and no guarding.  Musculoskeletal: Normal range of motion.  Neurological: She is alert. No cranial nerve deficit.  Skin: Skin is warm and dry. No rash noted.  Psychiatric: She has a normal mood and affect.    ED Course  Procedures (including critical care time) Labs Review Labs Reviewed - No data to display Imaging Review No results found.  EKG Interpretation   None       MDM  No diagnosis found. 1. Influenza  IV fluids given with minimal improvement in how she feels. Exam unchanged - remains stable, NAD. Recommend supportive care and PCP follow up.   Dewaine Oats, PA-C 11/16/13 2343

## 2013-11-17 MED ORDER — ACETAMINOPHEN 325 MG PO TABS
650.0000 mg | ORAL_TABLET | Freq: Once | ORAL | Status: AC
  Administered 2013-11-17: 650 mg via ORAL
  Filled 2013-11-17: qty 2

## 2013-11-17 NOTE — ED Provider Notes (Signed)
Medical screening examination/treatment/procedure(s) were performed by non-physician practitioner and as supervising physician I was immediately available for consultation/collaboration.  EKG Interpretation   None         Taaj Hurlbut B. Karle Starch, MD 11/17/13 1231

## 2016-04-03 ENCOUNTER — Encounter: Payer: Self-pay | Admitting: Emergency Medicine

## 2016-04-03 ENCOUNTER — Emergency Department (INDEPENDENT_AMBULATORY_CARE_PROVIDER_SITE_OTHER)
Admission: EM | Admit: 2016-04-03 | Discharge: 2016-04-03 | Disposition: A | Discharge status: Home / Self Care | Payer: Self-pay | Source: Home / Self Care | Attending: Family Medicine | Admitting: Family Medicine

## 2016-04-03 DIAGNOSIS — T148 Other injury of unspecified body region: Secondary | ICD-10-CM

## 2016-04-03 DIAGNOSIS — D1801 Hemangioma of skin and subcutaneous tissue: Secondary | ICD-10-CM

## 2016-04-03 DIAGNOSIS — T148XXA Other injury of unspecified body region, initial encounter: Secondary | ICD-10-CM

## 2016-04-03 NOTE — ED Notes (Signed)
Patient presents to [facility] with C/O bleeding times two from a small mole that she clipped off.

## 2016-04-03 NOTE — Discharge Instructions (Signed)
Please keep the bandage in place for at least 24-48 hours to help the wound heal from the bottom up.  Avoid strenuous activities or lots of bending and twisting at waist.  You may help stop the bleeding by laying flat and applying a cool compress over the bandage 15-20 minutes at a time, 3-4 times a day.

## 2016-04-03 NOTE — ED Provider Notes (Signed)
CSN: [id number]     Arrival date & time 04/03/16  1648 History   First MD Initiated Contact with Patient 04/03/16 1712     Chief Complaint  Patient presents with  . Laceration   (Consider location/radiation/quality/duration/timing/severity/associated sxs/prior Treatment) HPI  Sonia Diaz is a 45 y.o. 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  She has applied pressure, used multiple bandages and even used an OTC powder clotting mix without relief.  Denies pain to area. She is not on blood thinners.   History reviewed. No pertinent past medical history. History reviewed. No pertinent past surgical history. History reviewed. No pertinent family history. Social History  Substance Use Topics  . Smoking status: Never Smoker   . Smokeless tobacco: None  . Alcohol Use: No   OB History    No data available     Review of Systems  Constitutional: Negative for fever and chills.  Skin: Positive for wound. Negative for color change and rash.    Allergies  Benadryl and Sulfa antibiotics  Home Medications   Prior to Admission medications   Not on File   Meds Ordered and Administered this Visit  Medications - No data to display  BP 142/95 mmHg  Pulse 76  Temp(Src) 98.2 F (36.8 C) (Oral)  Resp 16  Ht 5\' 4"  (1.626 m)  Wt 167 lb (75.751 kg)  BMI 28.65 kg/m2  SpO2 99% No data found.   Physical Exam  Constitutional: She is oriented to person, place, and time. She appears well-developed and well-nourished.  HENT:  Head: Normocephalic and atraumatic.  Eyes: EOM are normal.  Neck: Normal range of motion.  Cardiovascular: Normal rate.   Pulmonary/Chest: Effort normal.  Musculoskeletal: Normal range of motion.  Neurological: She is alert and oriented to person, place, and time.  Skin: Skin is warm and dry.  Pinpoint sized open wound  oozing red blood on Left lower abdomen. No surrounding erythema or tenderness. No foreign bodies seen or palpated.   Psychiatric: She has a normal mood and affect. Her behavior is normal.  Nursing note and vitals reviewed.   ED Course  Procedures (including critical care time)  Labs Review Labs Reviewed - No data to display  Imaging Review No results found.  Wound on Left lower abdomen: direct pressure with guaze applied for 5 minutes w/o relief.  Attempted cautery w/o relief.  Moderate relief of oozing blood with silver nitrate. Bleeding controlled when pt lying flat but oozing when pt sits up.  Small amount of quick-clot powder applied, covered with guaze and tegaderm. Advised to leave bandage in place for 24-48 hours.  Encouraged to use cool compresses 3-4 times a day for 15-20 minutes at a time to help stop the bleeding.   MDM   1. Bleeding from wound ([employer])    Bleeding due to home removal of hemangioma.  Home care instructions provided. Discourage pt from removal other hemangiomas or skin tags at home due to risk of infection or continued bleeding like current wound.   F/u with PCP in 2-3 days if not improving. Patient verbalized understanding and agreement with treatment plan.     Noland Fordyce, PA-C 04/03/16 1746

## 2016-11-01 ENCOUNTER — Emergency Department
Admission: EM | Admit: 2016-11-01 | Discharge: 2016-11-01 | Disposition: A | Discharge status: Home / Self Care | Payer: Self-pay | Source: Home / Self Care | Attending: Family Medicine | Admitting: Family Medicine

## 2016-11-01 DIAGNOSIS — L739 Follicular disorder, unspecified: Secondary | ICD-10-CM

## 2016-11-01 DIAGNOSIS — Z598 Other problems related to housing and economic circumstances: Secondary | ICD-10-CM

## 2016-11-01 DIAGNOSIS — Z5989 Other problems related to housing and economic circumstances: Secondary | ICD-10-CM

## 2016-11-01 MED ORDER — DOXYCYCLINE HYCLATE 100 MG PO CAPS: 100.0000 mg | ORAL_CAPSULE | Freq: Two times a day (BID) | ORAL | 0 refills | Status: DC

## 2016-11-01 MED ORDER — PREDNISONE 20 MG PO TABS: ORAL_TABLET | ORAL | 0 refills | Status: DC

## 2016-11-01 NOTE — ED Triage Notes (Signed)
Has had a rash under her breast for about a month.  The last week it has covered whole abdomen.  Denies change in soap or detergents, and denies itching.

## 2016-11-01 NOTE — ED Provider Notes (Signed)
[facility]    CSN: [id number] Arrival date & time: 11/01/16  [id number]     History   Chief Complaint Chief Complaint  Patient presents with  . Rash    HPI Sonia Diaz is a 46 y.o. female.   Patient complains of onset of a "heat rash" between her breasts where she wears a sports bra.  Over the past two days she has developed a pruritic rash over abdomen.  She feels well otherwise.  No fevers, chills, and sweats.  The rash has not responded to Lotrimin spray.   The history is provided by the patient.  Rash  Location: abdomen and lower chest. Quality: dryness, itchiness, redness, swelling and weeping   Quality: not blistering, not bruising, not burning, not draining, not painful, not peeling and not scaling   Severity:  Mild Onset quality:  Gradual Duration:  2 days Timing:  Constant Progression:  Worsening Chronicity:  New Context: plant contact and pollen   Context: not animal contact, not chemical exposure, not exposure to similar rash, not food, not hot tub use, not insect bite/sting, not medications, not new detergent/soap, not nuts, not pregnancy and not sick contacts   Relieved by:  Nothing Worsened by:  Nothing Ineffective treatments:  Anti-fungal cream Associated symptoms: no abdominal pain, no diarrhea, no fatigue, no fever, no induration, no joint pain, no myalgias, no nausea, no sore throat and no URI     History reviewed. No pertinent past medical history.  There are no active problems to display for this patient.   History reviewed. No pertinent surgical history.  OB History    No data available       Home Medications    Prior to Admission medications   Medication Sig Start Date End Date Taking? Authorizing Provider  doxycycline (VIBRAMYCIN) 100 MG capsule Take 1 capsule (100 mg total) by mouth 2 (two) times daily. Take with food. 11/01/16   Kandra Nicolas, MD  predniSONE (DELTASONE) 20 MG tablet Take one tab by mouth twice daily for 4  days, then one daily for 3 days. Take with food. 11/01/16   Kandra Nicolas, MD    Family History History reviewed. No pertinent family history.  Social History Social History  Substance Use Topics  . Smoking status: Never Smoker  . Smokeless tobacco: Not on file  . Alcohol use No     Allergies   Benadryl [diphenhydramine hcl] and Sulfa antibiotics   Review of Systems Review of Systems  Constitutional: Negative for fatigue and fever.  HENT: Negative for sore throat.   Gastrointestinal: Negative for abdominal pain, diarrhea and nausea.  Musculoskeletal: Negative for arthralgias and myalgias.  Skin: Positive for rash.  All other systems reviewed and are negative.    Physical Exam Triage Vital Signs ED Triage Vitals  Enc Vitals Group     BP 11/01/16 0858 139/85     Pulse Rate 11/01/16 0858 73     Resp --      Temp 11/01/16 0858 98.3 F (36.8 C)     Temp Source 11/01/16 0858 Oral     SpO2 11/01/16 0858 100 %     Weight 11/01/16 0859 164 lb (74.4 kg)     Height 11/01/16 0859 5\' 4"  (1.626 m)     Head Circumference --      Peak Flow --      Pain Score 11/01/16 0900 0     Pain Loc --      Pain  Edu? --      Excl. in [place]? --    No data found.   Updated Vital Signs BP 139/85 (BP Location: Left Arm)   Pulse 73   Temp 98.3 F (36.8 C) (Oral)   Ht 5\' 4"  (1.626 m)   Wt 164 lb (74.4 kg)   LMP 10/26/2016   SpO2 100%   BMI 28.15 kg/m   Visual Acuity Right Eye Distance:   Left Eye Distance:   Bilateral Distance:    Right Eye Near:   Left Eye Near:    Bilateral Near:     Physical Exam  Constitutional: She appears well-developed and well-nourished. No distress.  HENT:  Head: Normocephalic.  Right Ear: External ear normal.  Left Ear: External ear normal.  Nose: Nose normal.  Mouth/Throat: Oropharynx is clear and moist.  Eyes: Pupils are equal, round, and reactive to light.  Neck: Normal range of motion.  Cardiovascular: Normal rate.   Pulmonary/Chest:  Effort normal.    Abdomen has scattered erythematous macules 43mm to 36mm diameter surrounding follicles.  No pustules present.  No tenderness to palpation or swelling.  Abdominal: Soft.  Neurological: She is alert.  Skin: Skin is dry. Rash noted.  Nursing note and vitals reviewed.    UC Treatments / Results  Labs (all labs ordered are listed, but only abnormal results are displayed) Labs Reviewed - No data to display  EKG  EKG Interpretation None       Radiology No results found.  Procedures Procedures (including critical care time)  Medications Ordered in UC Medications - No data to display   Initial Impression / Assessment and Plan / UC Course  I have reviewed the triage vital signs and the nursing notes.  Pertinent labs & imaging results that were available during my care of the patient were reviewed by me and considered in my medical decision making (see chart for details).  Clinical Course   Begin doxycycline for staph coverage, and prednisone burst/taper. Use a mild bath soap containing oil such as unscented Dove.  Followup with dermatologist if not resolved about 10 days.     Final Clinical Impressions(s) / UC Diagnoses   Final diagnoses:  Folliculitis    New Prescriptions New Prescriptions   DOXYCYCLINE (VIBRAMYCIN) 100 MG CAPSULE    Take 1 capsule (100 mg total) by mouth 2 (two) times daily. Take with food.   PREDNISONE (DELTASONE) 20 MG TABLET    Take one tab by mouth twice daily for 4 days, then one daily for 3 days. Take with food.     Kandra Nicolas, MD 11/01/16 1007

## 2016-11-01 NOTE — Discharge Instructions (Signed)
Use a mild bath soap containing oil such as unscented Dove.

## 2016-11-08 ENCOUNTER — Telehealth: Payer: Self-pay | Admitting: *Deleted

## 2016-11-08 DIAGNOSIS — Z5989 Other problems related to housing and economic circumstances: Secondary | ICD-10-CM

## 2016-11-08 DIAGNOSIS — R21 Rash and other nonspecific skin eruption: Secondary | ICD-10-CM

## 2016-11-08 DIAGNOSIS — Z598 Other problems related to housing and economic circumstances: Secondary | ICD-10-CM

## 2016-11-08 NOTE — Telephone Encounter (Signed)
Patient is unable to afford dermatology. Referral to [facility] Care Team placed.

## 2017-02-08 ENCOUNTER — Emergency Department
Admission: EM | Admit: 2017-02-08 | Discharge: 2017-02-08 | Disposition: A | Discharge status: Home / Self Care | Payer: Self-pay | Source: Home / Self Care | Attending: Family Medicine | Admitting: Family Medicine

## 2017-02-08 ENCOUNTER — Encounter: Payer: Self-pay | Admitting: *Deleted

## 2017-02-08 DIAGNOSIS — L298 Other pruritus: Secondary | ICD-10-CM

## 2017-02-08 DIAGNOSIS — Z8619 Personal history of other infectious and parasitic diseases: Secondary | ICD-10-CM

## 2017-02-08 DIAGNOSIS — N760 Acute vaginitis: Secondary | ICD-10-CM

## 2017-02-08 DIAGNOSIS — N898 Other specified noninflammatory disorders of vagina: Secondary | ICD-10-CM

## 2017-02-08 LAB — POCT URINALYSIS DIP (MANUAL ENTRY)
Bilirubin, UA: NEGATIVE
Blood, UA: NEGATIVE
Glucose, UA: NEGATIVE mg/dL
Ketones, POC UA: NEGATIVE mg/dL
Nitrite, UA: NEGATIVE
Protein Ur, POC: NEGATIVE mg/dL
Spec Grav, UA: <=1.005 — AB (ref 1.010–1.025)
Urobilinogen, UA: 0.2 E.U./dL
pH, UA: 6 (ref 5.0–8.0)

## 2017-02-08 MED ORDER — METRONIDAZOLE 500 MG PO TABS: 500.0000 mg | ORAL_TABLET | Freq: Two times a day (BID) | ORAL | 0 refills | Status: DC

## 2017-02-08 NOTE — Discharge Instructions (Signed)
°  Do not drink alcohol while taking metronidazole (Flagyl) as it may cause severe nausea and vomiting.  Refrain from sexual intercourse for 7 days. Be sure to have all partners tested and treated for STDs.  Practice safe sex by always wearing condoms.

## 2017-02-08 NOTE — ED Provider Notes (Signed)
CSN: [id number]     Arrival date & time 02/08/17  1752 History   First MD Initiated Contact with Patient 02/08/17 1831     Chief Complaint  Patient presents with  . Vaginal Itching   (Consider location/radiation/quality/duration/timing/severity/associated sxs/prior Treatment) HPI  Sonia Diaz is a 46 y.o. female presenting to UC with c/o vaginal itching and burning that started around 0300 this morning.  She reports hx of trichomonas in the past and notes this feels exactly the same.  Denies vaginal discharge or bleeding. She does not believe she has a yeast infection as last time she had these symptoms she treated it as yeast but symptoms never resolved, she later found out she had trich at that time.  Denies concern for UTI, gonorrhea or chlamydia but is willing to be tested while in UC. Denies fever, chills, n/v/d. Denies rashes or lesions in genital area.   History reviewed. No pertinent past medical history. Past Surgical History:  Procedure Laterality Date  . CESAREAN SECTION     x 2    Family History  Problem Relation Age of Onset  . Adopted: Yes   Social History  Substance Use Topics  . Smoking status: Never Smoker  . Smokeless tobacco: Never Used  . Alcohol use Yes     Comment: 1 q wk   OB History    No data available     Review of Systems  Constitutional: Negative for chills and fever.  Gastrointestinal: Negative for abdominal pain, diarrhea, nausea and vomiting.  Genitourinary: Positive for vaginal pain (itching and burning.). Negative for dysuria, flank pain, frequency, genital sores, hematuria, pelvic pain and urgency.  Skin: Negative for color change and rash.    Allergies  Benadryl [diphenhydramine hcl] and Sulfa antibiotics  Home Medications   Prior to Admission medications   Medication Sig Start Date End Date Taking? Authorizing Provider  metroNIDAZOLE (FLAGYL) 500 MG tablet Take 1 tablet (500 mg total) by mouth 2 (two) times daily. 02/08/17   Noland Fordyce, PA-C   Meds Ordered and Administered this Visit  Medications - No data to display  BP (!) 152/90 (BP Location: Left Arm)   Pulse 72   Temp 97.9 F (36.6 C) (Oral)   Resp 16   Ht 5\' 4"  (1.626 m)   Wt 170 lb (77.1 kg)   LMP 01/14/2017   SpO2 98%   BMI 29.18 kg/m  No data found.   Physical Exam  Constitutional: She is oriented to person, place, and time. She appears well-developed and well-nourished. No distress.  HENT:  Head: Normocephalic and atraumatic.  Mouth/Throat: Oropharynx is clear and moist.  Eyes: EOM are normal.  Neck: Normal range of motion. Neck supple.  Cardiovascular: Normal rate and regular rhythm.   Pulmonary/Chest: Effort normal and breath sounds normal. No respiratory distress. She has no wheezes. She has no rales.  Abdominal: Soft. She exhibits no distension and no mass. There is no tenderness. There is no rebound and no guarding.  Musculoskeletal: Normal range of motion.  Neurological: She is alert and oriented to person, place, and time.  Skin: Skin is warm and dry. She is not diaphoretic.  Psychiatric: She has a normal mood and affect. Her behavior is normal.  Nursing note and vitals reviewed.   Urgent Care Course     Procedures (including critical care time)  Labs Review Labs Reviewed  WET PREP BY MOLECULAR PROBE - Abnormal; Notable for the following:       Result  Value   Candida species DETECTED (*)    Gardnerella vaginalis DETECTED (*)    All other components within normal limits   Narrative:    Performed at:  [address], [address]                [place], [place] [address]  POCT URINALYSIS DIP (MANUAL ENTRY) - Abnormal; Notable for the following:    Spec Grav, UA <=1.005 (*)    Leukocytes, UA Trace (*)    All other components within normal limits  GC/CHLAMYDIA PROBE AMP    Imaging Review No results found.   MDM   1. Vaginal itching   2. Acute vaginitis   3. History of  trichomoniasis    Pt provided self swab for GC/chlamydia and wet prep.   UA: unremarkable   Labs pending. Pt requesting to start treatment for possible trichomoniasis today to get some relief.    Rx: Flagyl 500mg  BID for 7 days, advised against alcohol consumption. Pt understanding.  f/u with PCP as needed.    Noland Fordyce, PA-C 02/09/17 [phone]

## 2017-02-08 NOTE — ED Triage Notes (Signed)
Pt c/o vaginal itching and burning x 0300 today. She reports a history of trichomonas in the past and reports that the pain feels the same. Declined testing for other STD.

## 2017-02-09 ENCOUNTER — Telehealth: Payer: Self-pay | Admitting: Emergency Medicine

## 2017-02-09 LAB — GC/CHLAMYDIA PROBE AMP
CT Probe RNA: NOT DETECTED
GC Probe RNA: NOT DETECTED

## 2017-02-09 LAB — WET PREP BY MOLECULAR PROBE
Candida species: DETECTED — AB
Gardnerella vaginalis: DETECTED — AB
Trichomonas vaginosis: NOT DETECTED

## 2017-02-09 MED ORDER — FLUCONAZOLE 200 MG PO TABS: 200.0000 mg | ORAL_TABLET | Freq: Once | ORAL | 0 refills | Status: AC

## 2017-02-09 NOTE — Telephone Encounter (Signed)
After patient gave password, I went over her lab results and told her another rx was sent to pharmacy to cover positive yeast; and that the flagyl would cover [email or web address]

## 2017-02-09 NOTE — Telephone Encounter (Signed)
Wet prep came back Negative for trichomonas but Positive for yeast and BV. Pt should complete course of Flagyl.  Diflucan sent to pt's pharmacy on file for the yeast infection.

## 2017-12-06 ENCOUNTER — Emergency Department
Admission: EM | Admit: 2017-12-06 | Discharge: 2017-12-06 | Disposition: A | Discharge status: Home / Self Care | Payer: Self-pay | Source: Home / Self Care | Attending: Family Medicine | Admitting: Family Medicine

## 2017-12-06 ENCOUNTER — Other Ambulatory Visit: Payer: Self-pay

## 2017-12-06 DIAGNOSIS — J3489 Other specified disorders of nose and nasal sinuses: Secondary | ICD-10-CM

## 2017-12-06 MED ORDER — CLINDAMYCIN HCL 300 MG PO CAPS: 300.0000 mg | ORAL_CAPSULE | Freq: Three times a day (TID) | ORAL | 0 refills | Status: AC

## 2017-12-06 MED ORDER — MUPIROCIN 2 % EX OINT: 1.0000 "application " | TOPICAL_OINTMENT | Freq: Two times a day (BID) | CUTANEOUS | 0 refills | Status: DC

## 2017-12-06 NOTE — Discharge Instructions (Addendum)
Begin applying warm compress several times daily. May take Ibuprofen 200mg , 4 tabs every 8 hours with food.  If symptoms become significantly worse during the night or over the weekend, proceed to the local emergency room.

## 2017-12-06 NOTE — ED Triage Notes (Signed)
Pt was sick last week and blew her nose a lot.  She now has a sore on the right nare.  Now having facial pain, swelling , redness and numbness on cheeks, upper lip, and nose.

## 2017-12-06 NOTE — ED Provider Notes (Signed)
[facility]    CSN: [id number] Arrival date & time: 12/06/17  1029     History   Chief Complaint Chief Complaint  Patient presents with  . Facial Swelling  . Facial Pain    HPI Sonia Diaz is a 47 y.o. female.   Patient states that she had a cold a week ago, with significant nasal congestion and drainage.  Her cold is resolving, but about 6 days ago she developed a "sore" on her right nares.  Two days ago she developed redness and warmth of her nose, now worse.  This morning she had soreness under her nose.  No fevers, chills, and sweats.   The history is provided by the patient.    History reviewed. No pertinent past medical history.  There are no active problems to display for this patient.   Past Surgical History:  Procedure Laterality Date  . CESAREAN SECTION     x 2     OB History    No data available       Home Medications    Prior to Admission medications   Medication Sig Start Date End Date Taking? Authorizing Provider  clindamycin (CLEOCIN) 300 MG capsule Take 1 capsule (300 mg total) by mouth 3 (three) times daily for 10 days. 12/06/17 12/16/17  Kandra Nicolas, MD  mupirocin ointment (BACTROBAN) 2 % Apply 1 application topically 2 (two) times daily. 12/06/17   Kandra Nicolas, MD    Family History Family History  Adopted: Yes    Social History Social History   Tobacco Use  . Smoking status: Never Smoker  . Smokeless tobacco: Never Used  Substance Use Topics  . Alcohol use: Yes    Comment: 1 q wk  . Drug use: No     Allergies   Benadryl [diphenhydramine hcl] and Sulfa antibiotics   Review of Systems Review of Systems  Constitutional: Negative for activity change, appetite change, chills, diaphoresis, fatigue and fever.  HENT: Positive for congestion, facial swelling and rhinorrhea. Negative for sore throat.   Respiratory: Negative.   Cardiovascular: Negative.   Gastrointestinal: Negative.   Genitourinary: Negative.    Musculoskeletal: Negative.   Skin: Positive for color change.  Neurological: Negative for facial asymmetry and headaches.     Physical Exam Triage Vital Signs ED Triage Vitals  Enc Vitals Group     BP 12/06/17 1109 123/86     Pulse Rate 12/06/17 1109 (!) 105     Resp --      Temp 12/06/17 1109 98.2 F (36.8 C)     Temp Source 12/06/17 1109 Oral     SpO2 12/06/17 1109 98 %     Weight 12/06/17 1110 174 lb (78.9 kg)     Height 12/06/17 1110 5\' 4"  (1.626 m)     Head Circumference --      Peak Flow --      Pain Score 12/06/17 1109 6     Pain Loc --      Pain Edu? --      Excl. in [place]? --    No data found.  Updated Vital Signs BP 123/86 (BP Location: Right Arm)   Pulse (!) 105   Temp 98.2 F (36.8 C) (Oral)   Ht 5\' 4"  (1.626 m)   Wt 174 lb (78.9 kg)   LMP 11/21/2017   SpO2 98%   BMI 29.87 kg/m   Visual Acuity Right Eye Distance:   Left Eye Distance:   Bilateral  Distance:    Right Eye Near:   Left Eye Near:    Bilateral Near:     Physical Exam  Constitutional: She appears well-developed and well-nourished.  HENT:  Right Ear: Tympanic membrane, external ear and ear canal normal.  Left Ear: Tympanic membrane, external ear and ear canal normal.  Nose:    Mouth/Throat: Oropharynx is clear and moist.  Nose is erythematous, warm, tender to palpation and slightly swollen as noted on diagram.  No fluctuance.  Eyes: Conjunctivae and EOM are normal. Pupils are equal, round, and reactive to light.  Neck: Neck supple.  Cardiovascular: Normal heart sounds.  Pulmonary/Chest: Breath sounds normal.  Lymphadenopathy:    She has no cervical adenopathy.  Neurological: She is alert.  Skin: Skin is warm and dry.  Nursing note and vitals reviewed.    UC Treatments / Results  Labs (all labs ordered are listed, but only abnormal results are displayed) Labs Reviewed - No data to display  EKG  EKG Interpretation None       Radiology No results  found.  Procedures Procedures (including critical care time)  Medications Ordered in UC Medications - No data to display   Initial Impression / Assessment and Plan / UC Course  I have reviewed the triage vital signs and the nursing notes.  Pertinent labs & imaging results that were available during my care of the patient were reviewed by me and considered in my medical decision making (see chart for details).    Suspect staph cellulitis.  Begin empiric clindamycin, and topical mupirocin ointment. Begin applying warm compress several times daily. May take Ibuprofen 200mg , 4 tabs every 8 hours with food.  If symptoms become significantly worse during the night or over the weekend, proceed to the local emergency room.  Followup with ENT if not improving 2 to 3 days.    Final Clinical Impressions(s) / UC Diagnoses   Final diagnoses:  Nasal vestibulitis    ED Discharge Orders        Ordered    clindamycin (CLEOCIN) 300 MG capsule  3 times daily     12/06/17 1201    mupirocin ointment (BACTROBAN) 2 %  2 times daily     12/06/17 1201           Kandra Nicolas, MD 12/10/17 [phone]

## 2017-12-11 ENCOUNTER — Telehealth: Payer: Self-pay

## 2017-12-11 NOTE — Telephone Encounter (Signed)
Left message to call UC if any questions or concerns.  Contact information given.

## 2018-04-08 ENCOUNTER — Other Ambulatory Visit: Payer: Self-pay

## 2018-04-08 ENCOUNTER — Emergency Department
Admission: EM | Admit: 2018-04-08 | Discharge: 2018-04-08 | Disposition: A | Discharge status: Home / Self Care | Payer: Self-pay | Source: Home / Self Care

## 2018-04-08 DIAGNOSIS — F4321 Adjustment disorder with depressed mood: Secondary | ICD-10-CM

## 2018-04-08 DIAGNOSIS — L03313 Cellulitis of chest wall: Secondary | ICD-10-CM

## 2018-04-08 MED ORDER — DOXYCYCLINE HYCLATE 100 MG PO CAPS: 100.0000 mg | ORAL_CAPSULE | Freq: Two times a day (BID) | ORAL | 0 refills | Status: DC

## 2018-04-08 MED ORDER — LORAZEPAM 0.5 MG PO TABS: ORAL_TABLET | ORAL | 0 refills | Status: DC

## 2018-04-08 NOTE — ED Triage Notes (Signed)
Pt c/o "knot" under left breast. Noticed it Thursday, tried to pop it. Now appears infected. Previous hx of staph/mrsa.

## 2018-04-08 NOTE — Discharge Instructions (Addendum)
Change dressing daily until healed.  Keep wound clean and dry.  Return for any signs of increasing infection (or follow-up with family doctor):  Increasing redness, swelling, pain, heat, drainage, etc.

## 2018-04-08 NOTE — ED Provider Notes (Signed)
[name]    CSN: [id number] Arrival date & time: 04/08/18  1343     History   Chief Complaint Chief Complaint  Patient presents with  . Wound Infection    under Left breast    HPI Sonia Diaz is a 47 y.o. female.   Patient noticed a tender nodule beneath her left breast 3 days ago.  The lesion has gradually increased in size and become more painful, with surrounding erythema.  She has a past history of MRSA cellulitis. Mother reports that patient's boyfriend left one week ago, and patient has been depressed and crying more frequently but able to function.  She has had difficulty falling asleep.  No suicidal ideation.  Mother requests mild anxiety medication.   The history is provided by the patient and a parent.    History reviewed. No pertinent past medical history.  There are no active problems to display for this patient.   Past Surgical History:  Procedure Laterality Date  . CESAREAN SECTION     x 2     OB History   None      Home Medications    Prior to Admission medications   Medication Sig Start Date End Date Taking? Authorizing Provider  doxycycline (VIBRAMYCIN) 100 MG capsule Take 1 capsule (100 mg total) by mouth 2 (two) times daily. Take with food. 04/08/18   Kandra Nicolas, MD  LORazepam (ATIVAN) 0.5 MG tablet Take one tab PO once or twice daily as needed for anxiety 04/08/18   Kandra Nicolas, MD  mupirocin ointment (BACTROBAN) 2 % Apply 1 application topically 2 (two) times daily. 12/06/17   Kandra Nicolas, MD    Family History Family History  Adopted: Yes    Social History Social History   Tobacco Use  . Smoking status: Never Smoker  . Smokeless tobacco: Never Used  Substance Use Topics  . Alcohol use: Yes    Comment: 1 q wk  . Drug use: No     Allergies   Benadryl [diphenhydramine hcl] and Sulfa antibiotics   Review of Systems Review of Systems  Constitutional: Positive for fatigue. Negative for appetite  change, chills, diaphoresis and fever.  Skin: Positive for color change.  Psychiatric/Behavioral: Positive for decreased concentration, dysphoric mood and sleep disturbance.  All other systems reviewed and are negative.    Physical Exam Triage Vital Signs ED Triage Vitals  Enc Vitals Group     BP 04/08/18 1401 (!) 144/88     Pulse Rate 04/08/18 1401 87     Resp --      Temp 04/08/18 1401 98.3 F (36.8 C)     Temp Source 04/08/18 1401 Oral     SpO2 04/08/18 1401 100 %     Weight 04/08/18 1402 169 lb (76.7 kg)     Height 04/08/18 1402 5\' 4"  (1.626 m)     Head Circumference --      Peak Flow --      Pain Score 04/08/18 1402 0     Pain Loc --      Pain Edu? --      Excl. in [place]? --    No data found.  Updated Vital Signs BP (!) 144/88 (BP Location: Right Arm)   Pulse 87   Temp 98.3 F (36.8 C) (Oral)   Ht 5\' 4"  (1.626 m)   Wt 169 lb (76.7 kg)   LMP 04/02/2018 (Exact Date)   SpO2 100%   BMI 29.01  kg/m   Visual Acuity Right Eye Distance:   Left Eye Distance:   Bilateral Distance:    Right Eye Near:   Left Eye Near:    Bilateral Near:     Physical Exam  Constitutional: She is oriented to person, place, and time. She appears well-developed and well-nourished. No distress.  HENT:  Head: Normocephalic.  Mouth/Throat: Oropharynx is clear and moist.  Eyes: Pupils are equal, round, and reactive to light.  Cardiovascular: Normal rate.  Pulmonary/Chest: Effort normal.  Beneath the left breast is a cystic appearing lesion with 2cm by 3cm surrounding erythema.  Lesion is indurated and tender to palpation.    Neurological: She is alert and oriented to person, place, and time.  Skin: Skin is warm and dry.  Psychiatric: Her speech is normal and behavior is normal. Judgment and thought content normal. Cognition and memory are normal.  Mildly depressed mood; affect normal  Nursing note and vitals reviewed.     UC Treatments / Results  Labs (all labs ordered are listed,  but only abnormal results are displayed) Labs Reviewed - No data to display  EKG None  Radiology No results found.  Procedures Procedures  Incise and drain cyst/abscess Risks and benefits of procedure explained to patient and verbal consent obtained.  Using sterile technique, applied topical refrigerant spray followed by local anesthesia with 1% lidocaine with epinephrine, and cleansed affected area with Betadine and saline. Incised the central area of lesion with #11 blade.  Expressed small amount of blood but minimal purulent material.  No packing necessary.  Bandage applied.  Patient tolerated well   Medications Ordered in UC Medications - No data to display  Initial Impression / Assessment and Plan / UC Course  I have reviewed the triage vital signs and the nursing notes.  Pertinent labs & imaging results that were available during my care of the patient were reviewed by me and considered in my medical decision making (see chart for details).    Wound culture pending.  Begin empiric doxycycline for staph coverage. Rx for lorazepam 0.5mg , one tab once or twice daily as needed (#10, no refill); follow-up with PCP   Final Clinical Impressions(s) / UC Diagnoses   Final diagnoses:  Situational depression  Cellulitis of chest wall     Discharge Instructions     Change dressing daily until healed.  Keep wound clean and dry.  Return for any signs of increasing infection (or follow-up with family doctor):  Increasing redness, swelling, pain, heat, drainage, etc.      ED Prescriptions    Medication Sig Dispense Auth. Provider   LORazepam (ATIVAN) 0.5 MG tablet Take one tab PO once or twice daily as needed for anxiety 10 tablet Kandra Nicolas, MD   doxycycline (VIBRAMYCIN) 100 MG capsule Take 1 capsule (100 mg total) by mouth 2 (two) times daily. Take with food. 20 capsule Kandra Nicolas, MD        Kandra Nicolas, MD 04/09/18 2251

## 2018-04-11 ENCOUNTER — Telehealth: Payer: Self-pay | Admitting: Emergency Medicine

## 2018-04-11 LAB — WOUND CULTURE
MICRO NUMBER:: 90750695
SPECIMEN QUALITY:: ADEQUATE

## 2018-04-16 ENCOUNTER — Encounter: Payer: Self-pay | Admitting: Emergency Medicine

## 2018-04-16 ENCOUNTER — Emergency Department
Admission: EM | Admit: 2018-04-16 | Discharge: 2018-04-16 | Disposition: A | Discharge status: Home / Self Care | Payer: Self-pay | Source: Home / Self Care

## 2018-04-16 DIAGNOSIS — F4321 Adjustment disorder with depressed mood: Secondary | ICD-10-CM

## 2018-04-16 DIAGNOSIS — R102 Pelvic and perineal pain: Secondary | ICD-10-CM

## 2018-04-16 DIAGNOSIS — Z202 Contact with and (suspected) exposure to infections with a predominantly sexual mode of transmission: Secondary | ICD-10-CM

## 2018-04-16 DIAGNOSIS — N760 Acute vaginitis: Secondary | ICD-10-CM

## 2018-04-16 LAB — POCT WET + KOH PREP
Trich by wet prep: ABSENT
YEAST BY KOH: ABSENT
Yeast by wet prep: ABSENT

## 2018-04-16 LAB — POCT URINALYSIS DIP (MANUAL ENTRY)
Bilirubin, UA: NEGATIVE
Glucose, UA: NEGATIVE mg/dL
Ketones, POC UA: NEGATIVE mg/dL
Leukocytes, UA: NEGATIVE
NITRITE UA: NEGATIVE
PROTEIN UA: NEGATIVE mg/dL
RBC UA: NEGATIVE
Spec Grav, UA: 1.025 (ref 1.010–1.025)
UROBILINOGEN UA: 0.2 U/dL
pH, UA: 5.5 (ref 5.0–8.0)

## 2018-04-16 MED ORDER — FLUCONAZOLE 150 MG PO TABS: ORAL_TABLET | ORAL | 0 refills | Status: DC

## 2018-04-16 NOTE — Discharge Instructions (Addendum)
Drink plenty of fluids to continue to flush your bladder well.  Take the fluconazole 1 pill single dose, if not better in 3 or 4 days you can repeat it.  If necessary you can use over-the-counter GYNeLotrimin or Monistat.  If worse get rechecked  Specimen is being sent check on other possible STDs such as gonorrhea and chlamydia.

## 2018-04-16 NOTE — ED Provider Notes (Signed)
[facility]    CSN: [id number] Arrival date & time: 04/16/18  1555     History   Chief Complaint Chief Complaint  Patient presents with  . Vaginal Itching    HPI Sonia Diaz is a 47 y.o. female.  Patient was here a week ago and treated for an infection underneath her left breast with doxycycline.  Her boyfriend had died a few days to that suddenly.  He had had intercourse with her the evening before he died.  He had gone home and died sometime during the night.  That is last time she had sex.  She has had a vaginal irritation and burning sensation.  It feels like the walls of her vagina hurt and burn.  She has a history of having had trichomonas a few years ago that she got from her now-ex-husband.  She says she does not get yeast.  Apparently the place on her chest wall has gotten well. HPI  History reviewed. No pertinent past medical history.  There are no active problems to display for this patient.   Past Surgical History:  Procedure Laterality Date  . CESAREAN SECTION     x 2     OB History   None      Home Medications    Prior to Admission medications   Medication Sig Start Date End Date Taking? Authorizing Provider  doxycycline (VIBRAMYCIN) 100 MG capsule Take 1 capsule (100 mg total) by mouth 2 (two) times daily. Take with food. 04/08/18   Kandra Nicolas, MD  fluconazole (DIFLUCAN) 150 MG tablet Take a single 1 pill dose for yeast.  If not better in 3 or 4 days you can repeat once. 04/16/18   Posey Boyer, MD  LORazepam (ATIVAN) 0.5 MG tablet Take one tab PO once or twice daily as needed for anxiety 04/08/18   Kandra Nicolas, MD  mupirocin ointment (BACTROBAN) 2 % Apply 1 application topically 2 (two) times daily. 12/06/17   Kandra Nicolas, MD    Family History Family History  Adopted: Yes    Social History Social History   Tobacco Use  . Smoking status: Never Smoker  . Smokeless tobacco: Never Used  Substance Use Topics  .  Alcohol use: Yes    Comment: 1 q wk  . Drug use: No     Allergies   Benadryl [diphenhydramine hcl] and Sulfa antibiotics   Review of Systems Review of Systems Constitutional: Unremarkable Gastrointestinal: Unremarkable Genitourinary: Vaginal burning and pain as noted above.  Has an increased discharge.  Has more odor but nothing characteristic to the odor.  No dysuria.  Physical Exam Triage Vital Signs ED Triage Vitals [04/16/18 1618]  Enc Vitals Group     BP (!) 149/92     Pulse Rate 80     Resp      Temp 98.6 F (37 C)     Temp Source Oral     SpO2 100 %     Weight 170 lb (77.1 kg)     Height      Head Circumference      Peak Flow      Pain Score 0     Pain Loc      Pain Edu?      Excl. in [place]?    No data found.  Updated Vital Signs BP (!) 149/92 (BP Location: Right Arm)   Pulse 80   Temp 98.6 F (37 C) (Oral)   Wt  170 lb (77.1 kg)   LMP 04/02/2018 (Exact Date)   SpO2 100%   BMI 29.18 kg/m   Visual Acuity Right Eye Distance:   Left Eye Distance:   Bilateral Distance:    Right Eye Near:   Left Eye Near:    Bilateral Near:     Physical Exam Pleasant lady, alert.  No CVA tenderness.  Abdomen soft without masses.  Mild right suprapubic tenderness.  She says she has a hernia.  Pelvic exam: Normal external genitalia.  A little bit of cheesy white outside the introitus.  Vaginal mucosa little bit inflamed.  Some whitish plaques were noted.  Mildly watery discharge in the cul-de-sac.  Bimanual not done.  UC Treatments / Results  Labs (all labs ordered are listed, but only abnormal results are displayed) Labs Reviewed  C. TRACHOMATIS/N. GONORRHOEAE RNA  POCT URINALYSIS DIP (MANUAL ENTRY)  POCT WET + KOH PREP    EKG None  Radiology No results found.  Procedures Procedures (including critical care time)  Medications Ordered in UC Medications - No data to display  Initial Impression / Assessment and Plan / UC Course  I have reviewed the triage  vital signs and the nursing notes.  Pertinent labs & imaging results that were available during my care of the patient were reviewed by me and considered in my medical decision making (see chart for details).    Vagina looks like she has yeast, but KOH only had a couple of questionable spores.  Will treat anyhow. Final Clinical Impressions(s) / UC Diagnoses   Final diagnoses:  Vaginitis and vulvovaginitis  Possible exposure to STD  Vaginal pain  Grief     Discharge Instructions     Drink plenty of fluids to continue to flush your bladder well.  Take the fluconazole 1 pill single dose, if not better in 3 or 4 days you can repeat it.  If necessary you can use over-the-counter GYNeLotrimin or Monistat.  If worse get rechecked  Specimen is being sent check on other possible STDs such as gonorrhea and chlamydia.    ED Prescriptions    Medication Sig Dispense Auth. Provider   fluconazole (DIFLUCAN) 150 MG tablet Take a single 1 pill dose for yeast.  If not better in 3 or 4 days you can repeat once. 2 tablet Posey Boyer, MD     Controlled Substance Prescriptions [place] Controlled Substance Registry consulted? No   Posey Boyer, MD 04/16/18 1730

## 2018-04-16 NOTE — ED Triage Notes (Signed)
Pt c/o vaginal pain, d/c and itching x1 week. States hx of STD and sxs feel the same. She is still taking doxy for staph infection.

## 2018-04-17 LAB — C. TRACHOMATIS/N. GONORRHOEAE RNA
C. TRACHOMATIS RNA, TMA: NOT DETECTED
N. GONORRHOEAE RNA, TMA: NOT DETECTED

## 2018-04-18 ENCOUNTER — Telehealth: Payer: Self-pay

## 2018-04-18 ENCOUNTER — Telehealth: Payer: Self-pay | Admitting: Emergency Medicine

## 2018-04-18 NOTE — Telephone Encounter (Signed)
Patient returned call and was given lab results along with some comfort measures for her vaginitis discomfort. pk

## 2018-04-18 NOTE — Telephone Encounter (Signed)
Left message for patient to call back with lab results.

## 2018-06-01 ENCOUNTER — Emergency Department
Admission: EM | Admit: 2018-06-01 | Discharge: 2018-06-01 | Disposition: A | Discharge status: Home / Self Care | Payer: Self-pay | Source: Home / Self Care | Attending: Family Medicine | Admitting: Family Medicine

## 2018-06-01 ENCOUNTER — Other Ambulatory Visit: Payer: Self-pay

## 2018-06-01 DIAGNOSIS — L03313 Cellulitis of chest wall: Secondary | ICD-10-CM

## 2018-06-01 MED ORDER — DOXYCYCLINE HYCLATE 100 MG PO CAPS: 100.0000 mg | ORAL_CAPSULE | Freq: Two times a day (BID) | ORAL | 0 refills | Status: DC

## 2018-06-01 NOTE — ED Triage Notes (Signed)
Pt has had a abscess under her left breast previously, and had another "pop up" last night and it popped this morning.

## 2018-06-01 NOTE — Discharge Instructions (Addendum)
Apply warm compress 3 to 4 times daily.

## 2018-06-01 NOTE — ED Provider Notes (Signed)
[facility]    CSN: [id number] Arrival date & time: 06/01/18  1437     History   Chief Complaint Chief Complaint  Patient presents with  . Abscess    HPI Sonia Diaz is a 47 y.o. female.   Patient has a past history of abscess under her left breast.  She has had swelling there for several days and this morning the area drained a significant amount of pus.  She feels well otherwise.  No fevers, chills, and sweats.  The history is provided by the patient.  Abscess  Abscess location: beneath left breast. Size:  1.5cm diameter Abscess quality: draining, painful and redness   Duration:  2 days Progression:  Partially resolved Pain details:    Quality:  Dull   Severity:  Mild   Duration:  2 days   Progression:  Partially resolved Chronicity:  Recurrent Relieved by:  None tried Ineffective treatments:  None tried Associated symptoms: no fever   Risk factors: hx of MRSA     History reviewed. No pertinent past medical history.  There are no active problems to display for this patient.   Past Surgical History:  Procedure Laterality Date  . CESAREAN SECTION     x 2     OB History   None      Home Medications    Prior to Admission medications   Medication Sig Start Date End Date Taking? Authorizing Provider  doxycycline (VIBRAMYCIN) 100 MG capsule Take 1 capsule (100 mg total) by mouth 2 (two) times daily. Take with food. 06/01/18   Kandra Nicolas, MD  fluconazole (DIFLUCAN) 150 MG tablet Take a single 1 pill dose for yeast.  If not better in 3 or 4 days you can repeat once. 04/16/18   Posey Boyer, MD  LORazepam (ATIVAN) 0.5 MG tablet Take one tab PO once or twice daily as needed for anxiety 04/08/18   Kandra Nicolas, MD  mupirocin ointment (BACTROBAN) 2 % Apply 1 application topically 2 (two) times daily. 12/06/17   Kandra Nicolas, MD    Family History Family History  Adopted: Yes    Social History Social History   Tobacco Use  .  Smoking status: Never Smoker  . Smokeless tobacco: Never Used  Substance Use Topics  . Alcohol use: Yes    Comment: 1 q wk  . Drug use: No     Allergies   Benadryl [diphenhydramine hcl] and Sulfa antibiotics   Review of Systems Review of Systems  Constitutional: Negative for fever.  All other systems reviewed and are negative.    Physical Exam Triage Vital Signs ED Triage Vitals  Enc Vitals Group     BP 06/01/18 1522 (!) 158/93     Pulse Rate 06/01/18 1522 89     Resp --      Temp 06/01/18 1522 98.3 F (36.8 C)     Temp Source 06/01/18 1522 Oral     SpO2 06/01/18 1522 99 %     Weight 06/01/18 1523 174 lb (78.9 kg)     Height 06/01/18 1523 5\' 4"  (1.626 m)     Head Circumference --      Peak Flow --      Pain Score 06/01/18 1523 0     Pain Loc --      Pain Edu? --      Excl. in [place]? --    No data found.  Updated Vital Signs BP (!) 158/93 (BP  Location: Right Arm)   Pulse 89   Temp 98.3 F (36.8 C) (Oral)   Ht 5\' 4"  (1.626 m)   Wt 78.9 kg   SpO2 99%   BMI 29.87 kg/m   Visual Acuity Right Eye Distance:   Left Eye Distance:   Bilateral Distance:    Right Eye Near:   Left Eye Near:    Bilateral Near:     Physical Exam  Constitutional: She appears well-developed and well-nourished. No distress.  HENT:  Head: Normocephalic.  Eyes: Pupils are equal, round, and reactive to light.  Cardiovascular: Normal rate.  Pulmonary/Chest: Effort normal.  Chaperoned exam:  Beneath the left breast is an 59mm by 1.5cm erythematous, indurated area that is tender to palpation but not fluctuant.    Neurological: She is alert.  Skin: Skin is warm and dry.  Vitals reviewed.    UC Treatments / Results  Labs (all labs ordered are listed, but only abnormal results are displayed) Labs Reviewed - No data to display  EKG None  Radiology No results found.  Procedures Procedures (including critical care time)  Medications Ordered in UC Medications - No data to  display  Initial Impression / Assessment and Plan / UC Course  I have reviewed the triage vital signs and the nursing notes.  Pertinent labs & imaging results that were available during my care of the patient were reviewed by me and considered in my medical decision making (see chart for details).    I and D not necessary.  Begin doxycycline. Followup with Family Doctor if not improved in 3 to 4 days.   Final Clinical Impressions(s) / UC Diagnoses   Final diagnoses:  Cellulitis of chest wall     Discharge Instructions     Apply warm compress 3 to 4 times daily.      ED Prescriptions    Medication Sig Dispense Auth. Provider   doxycycline (VIBRAMYCIN) 100 MG capsule Take 1 capsule (100 mg total) by mouth 2 (two) times daily. Take with food. 20 capsule Kandra Nicolas, MD         Kandra Nicolas, MD 06/05/18 [phone]

## 2018-12-09 ENCOUNTER — Emergency Department (INDEPENDENT_AMBULATORY_CARE_PROVIDER_SITE_OTHER)
Admission: EM | Admit: 2018-12-09 | Discharge: 2018-12-09 | Disposition: A | Discharge status: Home / Self Care | Payer: Self-pay | Source: Home / Self Care

## 2018-12-09 ENCOUNTER — Encounter: Payer: Self-pay | Admitting: Emergency Medicine

## 2018-12-09 DIAGNOSIS — R6889 Other general symptoms and signs: Secondary | ICD-10-CM

## 2018-12-09 DIAGNOSIS — J029 Acute pharyngitis, unspecified: Secondary | ICD-10-CM

## 2018-12-09 DIAGNOSIS — J020 Streptococcal pharyngitis: Secondary | ICD-10-CM

## 2018-12-09 LAB — POCT RAPID STREP A (OFFICE): Rapid Strep A Screen: POSITIVE — AB

## 2018-12-09 MED ORDER — AMOXICILLIN 500 MG PO CAPS: 500.0000 mg | ORAL_CAPSULE | Freq: Two times a day (BID) | ORAL | 0 refills | Status: AC

## 2018-12-09 MED ORDER — OSELTAMIVIR PHOSPHATE 75 MG PO CAPS: 75.0000 mg | ORAL_CAPSULE | Freq: Two times a day (BID) | ORAL | 0 refills | Status: DC

## 2018-12-09 NOTE — Discharge Instructions (Signed)

## 2018-12-09 NOTE — ED Triage Notes (Signed)
Patient c/o sore throat, chest tightness, fever this am, took chlorcidin x 2, cough.

## 2018-12-09 NOTE — ED Provider Notes (Signed)
[name]    CSN: [id number] Arrival date & time: 12/09/18  1414     History   Chief Complaint Chief Complaint  Patient presents with  . URI    HPI Sonia Diaz is a 48 y.o. female.   HPI Sonia Diaz is a 48 y.o. female presenting to UC with c/o sore throat, chest tightness, and fever up to 101*F this morning. She took 2 coricidin this morning w/o relief. 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 Pt wondering if it is okay for her to go to work tomorrow. She did not receive the flu vaccine. No known sick contacts.   History reviewed. No pertinent past medical history.  There are no active problems to display for this patient.   Past Surgical History:  Procedure Laterality Date  . CESAREAN SECTION     x 2     OB History   No obstetric history on file.      Home Medications    Prior to Admission medications   Medication Sig Start Date End Date Taking? Authorizing Provider  amoxicillin (AMOXIL) 500 MG capsule Take 1 capsule (500 mg total) by mouth 2 (two) times daily for 10 days. 12/09/18 12/19/18  Noe Gens, PA-C  oseltamivir (TAMIFLU) 75 MG capsule Take 1 capsule (75 mg total) by mouth every 12 (twelve) hours. 12/09/18   Noe Gens, PA-C    Family History Family History  Adopted: Yes    Social History Social History   Tobacco Use  . Smoking status: Never Smoker  . Smokeless tobacco: Never Used  Substance Use Topics  . Alcohol use: Yes    Comment: 1 q wk  . Drug use: No     Allergies   Benadryl [diphenhydramine hcl] and Sulfa antibiotics   Review of Systems Review of Systems  Constitutional: Positive for chills, fatigue and fever.  HENT: Positive for congestion and sore throat. Negative for ear pain, trouble swallowing and voice change.   Respiratory: Positive for cough. Negative for shortness of breath.   Cardiovascular:  Negative for chest pain and palpitations.  Gastrointestinal: Negative for abdominal pain, diarrhea, nausea and vomiting.  Musculoskeletal: Positive for arthralgias, back pain and myalgias.  Skin: Negative for rash.  Neurological: Positive for headaches. Negative for dizziness and light-headedness.     Physical Exam Triage Vital Signs ED Triage Vitals  Enc Vitals Group     BP 12/09/18 1429 (!) 170/107     Pulse Rate 12/09/18 1429 (!) 113     Resp --      Temp 12/09/18 1429 99 F (37.2 C)     Temp Source 12/09/18 1429 Oral     SpO2 12/09/18 1429 98 %     Weight 12/09/18 1430 179 lb 8 oz (81.4 kg)     Height 12/09/18 1430 5\' 4"  (1.626 m)     Head Circumference --      Peak Flow --      Pain Score 12/09/18 1429 0     Pain Loc --      Pain Edu? --      Excl. in [place]? --    No data found.  Updated Vital Signs BP (!) 170/107 (BP Location: Right Arm)   Pulse (!) 113   Temp 99 F (37.2 C) (Oral)   Ht 5\' 4"  (1.626 m)  Wt 179 lb 8 oz (81.4 kg)   SpO2 98%   BMI 30.81 kg/m   Visual Acuity Right Eye Distance:   Left Eye Distance:   Bilateral Distance:    Right Eye Near:   Left Eye Near:    Bilateral Near:     Physical Exam Vitals signs and nursing note reviewed.  Constitutional:      Appearance: Normal appearance. She is well-developed.  HENT:     Head: Normocephalic and atraumatic.     Right Ear: Tympanic membrane normal.     Left Ear: Tympanic membrane normal.     Nose: Nose normal.     Right Sinus: No maxillary sinus tenderness or frontal sinus tenderness.     Left Sinus: No maxillary sinus tenderness or frontal sinus tenderness.     Mouth/Throat:     Lips: Pink.     Mouth: Mucous membranes are moist.     Pharynx: Uvula midline. Oropharyngeal exudate and posterior oropharyngeal erythema present. No pharyngeal swelling or uvula swelling.  Neck:     Musculoskeletal: Normal range of motion.  Cardiovascular:     Rate and Rhythm: Regular rhythm. Tachycardia present.   Pulmonary:     Effort: Pulmonary effort is normal. No respiratory distress.     Breath sounds: Normal breath sounds. No stridor. No wheezing or rhonchi.  Musculoskeletal: Normal range of motion.  Skin:    General: Skin is warm and dry.  Neurological:     Mental Status: She is alert and oriented to person, place, and time.  Psychiatric:        Behavior: Behavior normal.      UC Treatments / Results  Labs (all labs ordered are listed, but only abnormal results are displayed) Labs Reviewed  POCT RAPID STREP A (OFFICE) - Abnormal; Notable for the following components:      Result Value   Rapid Strep A Screen Positive (*)    All other components within normal limits    EKG None  Radiology No results found.  Procedures Procedures (including critical care time)  Medications Ordered in UC Medications - No data to display  Initial Impression / Assessment and Plan / UC Course  I have reviewed the triage vital signs and the nursing notes.  Pertinent labs & imaging results that were available during my care of the patient were reviewed by me and considered in my medical decision making (see chart for details).     Rapid strep: POSITIVE Hx also c/w influenza given sudden onset of fever, body aches, lack of flu vaccine and active flu season Will tx with tamiflu and amoxicillin AVS provided Work note for 2 days provided, encouraged f/u Wednesday if not improving  Final Clinical Impressions(s) / UC Diagnoses   Final diagnoses:  Exudative pharyngitis  Strep pharyngitis  Flu-like symptoms     Discharge Instructions      Please take antibiotics as prescribed and be sure to complete entire course even if you start to feel better to ensure infection does not come back.  You may take 500mg  acetaminophen every 4-6 hours or in combination with ibuprofen 400-600mg  every 6-8 hours as needed for pain, inflammation, and fever.  Be sure to well hydrated with clear liquids and  get at least 8 hours of sleep at night, preferably more while sick.   Please follow up with family medicine in 1 week if needed.     ED Prescriptions    Medication Sig Dispense Auth. Provider   oseltamivir (  TAMIFLU) 75 MG capsule Take 1 capsule (75 mg total) by mouth every 12 (twelve) hours. 10 capsule Leeroy Cha O, PA-C   amoxicillin (AMOXIL) 500 MG capsule Take 1 capsule (500 mg total) by mouth 2 (two) times daily for 10 days. 20 capsule Noe Gens, PA-C     Controlled Substance Prescriptions [place] Controlled Substance Registry consulted? Not Applicable   Tyrell Antonio 12/09/18 [id number]

## 2019-11-14 ENCOUNTER — Emergency Department
Admission: EM | Admit: 2019-11-14 | Discharge: 2019-11-14 | Disposition: A | Discharge status: Home / Self Care | Payer: Self-pay | Source: Home / Self Care

## 2019-11-14 DIAGNOSIS — L304 Erythema intertrigo: Secondary | ICD-10-CM

## 2019-11-14 LAB — POCT FASTING CBG KUC MANUAL ENTRY: POCT Glucose (KUC): 95 mg/dL (ref 70–99)

## 2019-11-14 MED ORDER — FLUCONAZOLE 200 MG PO TABS: 200.0000 mg | ORAL_TABLET | Freq: Every day | ORAL | 7 refills | Status: DC

## 2019-11-14 NOTE — Discharge Instructions (Signed)
Have your Physician recheck your blood pressure next week

## 2019-11-14 NOTE — ED Triage Notes (Signed)
Pt c/o rash in groin area x 9 days. Some itching, worse at night. Did a telemedicine visit Mon and was rx'd Prednisone and topical antifungal. Went to Tanning bed last Tues , rash started next day.

## 2019-11-14 NOTE — ED Provider Notes (Signed)
[facility]    CSN: [id number] Arrival date & time: 11/14/19  0806      History   Chief Complaint Chief Complaint  Patient presents with   Rash    HPI YOUSRA Diaz is a 49 y.o. female.   The history is provided by the patient. No language interpreter was used.  Rash Location:  Leg Quality: itchiness and redness   Severity:  Moderate Onset quality:  Gradual Timing:  Constant Progression:  Worsening Relieved by:  Nothing Worsened by:  Nothing Associated symptoms: no fever   Pt reports rash began after using a tanning bed.  Pt has redness inner thighs and perineum.  Pt also has red areas under bra.   No past medical history on file.  There are no problems to display for this patient.   Past Surgical History:  Procedure Laterality Date   CESAREAN SECTION     x 2     OB History   No obstetric history on file.      Home Medications    Prior to Admission medications   Medication Sig Start Date End Date Taking? Authorizing Provider  triamcinolone cream (KENALOG) 0.1 % Apply 1 application topically 2 (two) times daily.   Yes [provider]  oseltamivir (TAMIFLU) 75 MG capsule Take 1 capsule (75 mg total) by mouth every 12 (twelve) hours. 12/09/18   Noe Gens, PA-C  predniSONE (DELTASONE) 20 MG tablet     [provider]    Family History Family History  Adopted: Yes    Social History Social History   Tobacco Use   Smoking status: Never Smoker   Smokeless tobacco: Never Used  Substance Use Topics   Alcohol use: Yes    Comment: 1 q wk   Drug use: No     Allergies   Benadryl [diphenhydramine hcl] and Sulfa antibiotics   Review of Systems Review of Systems  Constitutional: Negative for fever.  Skin: Positive for rash.  All other systems reviewed and are negative.    Physical Exam Triage Vital Signs ED Triage Vitals  Enc Vitals Group     BP 11/14/19 0821 (!) 172/101     Pulse Rate 11/14/19 0821 94      Resp 11/14/19 0821 18     Temp 11/14/19 0821 98 F (36.7 C)     Temp Source 11/14/19 0821 Oral     SpO2 11/14/19 0821 99 %     Weight 11/14/19 0822 182 lb (82.6 kg)     Height 11/14/19 0822 5\' 4"  (1.626 m)     Head Circumference --      Peak Flow --      Pain Score 11/14/19 0822 0     Pain Loc --      Pain Edu? --      Excl. in [place]? --    No data found.  Updated Vital Signs BP (!) 172/101 (BP Location: Left Arm)    Pulse 94    Temp 98 F (36.7 C) (Oral)    Resp 18    Ht 5\' 4"  (1.626 m)    Wt 82.6 kg    LMP 10/20/2019 (Approximate)    SpO2 99%    BMI 31.24 kg/m   Visual Acuity Right Eye Distance:   Left Eye Distance:   Bilateral Distance:    Right Eye Near:   Left Eye Near:    Bilateral Near:     Physical Exam   UC Treatments /  Results  Labs (all labs ordered are listed, but only abnormal results are displayed) Labs Reviewed  POCT FASTING [facility] [place]    EKG   Radiology No results found.  Procedures Procedures (including critical care time)  Medications Ordered in UC Medications - No data to display  Initial Impression / Assessment and Plan / UC Course  I have reviewed the triage vital signs and the nursing notes.  Pertinent labs & imaging results that were available during my care of the patient were reviewed by me and considered in my medical decision making (see chart for details).     MDM: Pt has been on oral steroids and topical antifungals.  I will try diflucan.  Pt advised to keep area dry.  Final Clinical Impressions(s) / UC Diagnoses   Final diagnoses:  Intertrigo     Discharge Instructions     Have your Physician recheck your blood pressure next week     ED Prescriptions    Medication Sig Dispense Auth. Provider   fluconazole (DIFLUCAN) 200 MG tablet Take 1 tablet (200 mg total) by mouth daily. 7 tablet Fransico Meadow, [place]     PDMP not reviewed this encounter.   Fransico Meadow, [place] 11/14/19 [phone]

## 2019-11-18 ENCOUNTER — Encounter: Payer: Self-pay | Admitting: Emergency Medicine

## 2019-11-18 ENCOUNTER — Emergency Department
Admission: EM | Admit: 2019-11-18 | Discharge: 2019-11-18 | Disposition: A | Discharge status: Home / Self Care | Payer: Self-pay | Source: Home / Self Care

## 2019-11-18 ENCOUNTER — Other Ambulatory Visit: Payer: Self-pay

## 2019-11-18 DIAGNOSIS — R21 Rash and other nonspecific skin eruption: Secondary | ICD-10-CM

## 2019-11-18 MED ORDER — CLOTRIMAZOLE-BETAMETHASONE 1-0.05 % EX CREA: TOPICAL_CREAM | CUTANEOUS | 0 refills | Status: AC

## 2019-11-18 MED ORDER — FAMOTIDINE 40 MG PO TABS: 40.0000 mg | ORAL_TABLET | Freq: Two times a day (BID) | ORAL | 0 refills | Status: DC

## 2019-11-18 MED ORDER — CLOTRIMAZOLE-BETAMETHASONE 1-0.05 % EX CREA: TOPICAL_CREAM | CUTANEOUS | 0 refills | Status: DC

## 2019-11-18 NOTE — ED Triage Notes (Signed)
Patient here for follow up on new rash starting 3 days ago on trunk and shoulders; very itchy. Thinks related to rx given last week.

## 2019-11-18 NOTE — Discharge Instructions (Addendum)
Apply cream twice daily as directed. Continue moisturizing skin with emollient cream.  Take Pepcid 40 mg twice daily for 10 days.  If rash worsens or does not improve or experienced other on affected areas of the body follow-up immediately with your primary care provider as she may require referral to a dermatologist.

## 2019-11-18 NOTE — ED Provider Notes (Signed)
[facility]    CSN: [id number] Arrival date & time: 11/18/19  [id number]      History   Chief Complaint Chief Complaint  Patient presents with  . Rash    HPI Sonia Diaz is a 49 y.o. female.   HPI  Sonia Diaz presents for evaluation of a skin rash that has remained present for over 2 weeks. Initially treated by [facility] with prednisone and triamcinolone cream for rash of inner/upper thigh groin region bilaterally. Initial steriodal treatment improved itching, however, rash persisted. She was seen here at [facility] on 1/28/21wih a concern for worsening of rash. Provider at the time diagnosed her with intertrigo and prescribed 7 days of Diflucan.  Subsequently 4 days ago she developed a rash on her upper chest wall including her breast and rash is now expanded to her back.  The rash is maculopapular, skin is warm, and constantly pruritic.  Patient is allergic to Benadryl therefore has not attempted relief with any other medication.  She is concerned for possible medication reaction and that she now has a rash on affected areas and her main concern is that the rash constantly itchy.  She also notes today that it felt like her tongue was enlarged.  Denies any difficulty breathing,nausea or vomiting.  She has two remaining doses of the fluconazole remaining.   History reviewed. No pertinent past medical history.  There are no problems to display for this patient.   Past Surgical History:  Procedure Laterality Date  . CESAREAN SECTION     x 2     OB History   No obstetric history on file.      Home Medications    Prior to Admission medications   Medication Sig Start Date End Date Taking? Authorizing Provider  fluconazole (DIFLUCAN) 200 MG tablet Take 1 tablet (200 mg total) by mouth daily. 11/14/19   Fransico Meadow, PA-C  oseltamivir (TAMIFLU) 75 MG capsule Take 1 capsule (75 mg total) by mouth every 12 (twelve) hours. 12/09/18   Noe Gens, PA-C  predniSONE  (DELTASONE) 20 MG tablet     [provider]  triamcinolone cream (KENALOG) 0.1 % Apply 1 application topically 2 (two) times daily.    [provider]    Family History Family History  Adopted: Yes    Social History Social History   Tobacco Use  . Smoking status: Never Smoker  . Smokeless tobacco: Never Used  Substance Use Topics  . Alcohol use: Yes    Comment: 1 q wk  . Drug use: No     Allergies   Benadryl [diphenhydramine hcl] and Sulfa antibiotics   Review of Systems Review of Systems Pertinent negatives listed in HPI Physical Exam Triage Vital Signs ED Triage Vitals  Enc Vitals Group     BP 11/18/19 0835 (!) 149/100     Pulse Rate 11/18/19 0835 (!) 102     Resp 11/18/19 0835 18     Temp 11/18/19 0835 98.7 F (37.1 C)     Temp Source 11/18/19 0835 Oral     SpO2 11/18/19 0835 99 %     Weight 11/18/19 0836 180 lb (81.6 kg)     Height 11/18/19 0836 5\' 4"  (1.626 m)     Head Circumference --      Peak Flow --      Pain Score 11/18/19 0836 0     Pain Loc --      Pain Edu? --  Excl. in [place]? --    No data found.  Updated Vital Signs BP (!) 149/100 (BP Location: Right Arm)   Pulse (!) 102   Temp 98.7 F (37.1 C) (Oral)   Resp 18   Ht 5\' 4"  (1.626 m)   Wt 180 lb (81.6 kg)   LMP 10/20/2019 (Approximate)   SpO2 99%   BMI 30.90 kg/m   Visual Acuity Right Eye Distance:   Left Eye Distance:   Bilateral Distance:    Right Eye Near:   Left Eye Near:    Bilateral Near:     Physical Exam General appearance: alert, well developed, well nourished, cooperative and in no distress Head: Normocephalic, without obvious abnormality, atraumatic Respiratory: Respirations even and unlabored, normal respiratory rate Heart: rate and rhythm normal. No gallop or murmurs noted on exam  Abdomen: BS +, no distention, no rebound tenderness, or no mass Extremities: No gross deformities Skin: Positive maculopapular rash, erythema (affected areas).     Psych: Appropriate mood and affect. Neurologic:Alert, oriented to person, place, and time, thought content appropriate.  UC Treatments / Results  Labs (all labs ordered are listed, but only abnormal results are displayed) Labs Reviewed - No data to display  EKG   Radiology No results found.  Procedures Procedures (including critical care time)  Medications Ordered in UC Medications - No data to display  Initial Impression / Assessment and Plan / UC Course  I have reviewed the triage vital signs and the nursing notes.  Pertinent labs & imaging results that were available during my care of the patient were reviewed by me and considered in my medical decision making (see chart for details).    Rash of unknown etiology however suspect possible reaction to oral fluconazole as patient reports a distant history of a red dye allergy.  Advised to discontinue remaining doses of fluconazole.  Patient is also allergic to Benadryl therefore will manage pruritus with famotidine 40 mg twice daily for 10 days.  Topical management of rash will treat with clotrimazole and betamethasone twice daily for 10 days.  Patient advised if rash worsens or moves to unaffected areas to follow-up with primary care provider as she may require further evaluation by dermatologist.  Her skin was excessively dry on exam today therefore advised to continue daily emollient applications to prevent drying of skin which will only worsen itching.  Patient verbalized understanding and agreement of plan.  Final Clinical Impressions(s) / UC Diagnoses   Final diagnoses:  Rash and nonspecific skin eruption     Discharge Instructions     Apply cream twice daily as directed. Continue moisturizing skin with emollient cream.  Take Pepcid 40 mg twice daily for 10 days.  If rash worsens or does not improve or experienced other on affected areas of the body follow-up immediately with your primary care provider as she may require  referral to a dermatologist.    ED Prescriptions    Medication Sig Dispense Auth. Provider   clotrimazole-betamethasone (LOTRISONE) cream  (Status: Discontinued) Apply to affected area 2 times daily prn 180 g Scot Jun, FNP   famotidine (PEPCID) 40 MG tablet Take 1 tablet (40 mg total) by mouth 2 (two) times daily for 10 days. 20 tablet Scot Jun, FNP   clotrimazole-betamethasone (LOTRISONE) cream Apply to affected area 2 times daily prn 180 g Scot Jun, FNP     PDMP not reviewed this encounter.   Scot Jun, [place] 11/18/19 [phone]

## 2020-10-12 ENCOUNTER — Emergency Department
Admission: RE | Admit: 2020-10-12 | Discharge: 2020-10-12 | Disposition: A | Discharge status: Home / Self Care | Payer: BLUE CROSS/BLUE SHIELD | Source: Ambulatory Visit

## 2020-10-12 ENCOUNTER — Other Ambulatory Visit: Payer: Self-pay

## 2020-10-12 VITALS — BP 174/108 | HR 89 | Temp 98.5°F | Resp 18 | Ht 64.0 in | Wt 170.0 lb

## 2020-10-12 DIAGNOSIS — L739 Follicular disorder, unspecified: Secondary | ICD-10-CM

## 2020-10-12 MED ORDER — SARNA 0.5-0.5 % EX LOTN: 1.0000 "application " | TOPICAL_LOTION | CUTANEOUS | 0 refills | Status: DC | PRN

## 2020-10-12 MED ORDER — CEPHALEXIN 500 MG PO CAPS
500.0000 mg | ORAL_CAPSULE | Freq: Two times a day (BID) | ORAL | 0 refills | Status: DC
Start: 2020-10-12 — End: 2020-12-01

## 2020-10-12 NOTE — ED Triage Notes (Signed)
Rash on back x 10 days, itches

## 2020-10-12 NOTE — ED Provider Notes (Signed)
[facility]    CSN: [id number] Arrival date & time: 10/12/20  1731      History   Chief Complaint Chief Complaint  Patient presents with  . Rash    HPI Sonia Diaz is a 49 y.o. female.   Patient has pruritic rash on lower back.  Feels similar to what she had and was seen here previously.  Diagnosed as folliculitis at that time.  She does occasionally frequent tanning bed but no hot tubs  HPI  History reviewed. No pertinent past medical history.  There are no problems to display for this patient.   Past Surgical History:  Procedure Laterality Date  . CESAREAN SECTION     x 2     OB History   No obstetric history on file.      Home Medications    Prior to Admission medications   Medication Sig Start Date End Date Taking? Authorizing Provider  clotrimazole-betamethasone (LOTRISONE) cream Apply 1 application topically 2 (two) times daily.   Yes [provider]    Family History Family History  Adopted: Yes    Social History Social History   Tobacco Use  . Smoking status: Never Smoker  . Smokeless tobacco: Never Used  Vaping Use  . Vaping Use: Never used  Substance Use Topics  . Alcohol use: Yes    Comment: 1 q wk  . Drug use: No     Allergies   Benadryl [diphenhydramine hcl], Diflucan [fluconazole], and Sulfa antibiotics   Review of Systems Review of Systems  Skin: Positive for rash.  All other systems reviewed and are negative.    Physical Exam Triage Vital Signs ED Triage Vitals  Enc Vitals Group     BP 10/12/20 1747 (!) 174/108     Pulse Rate 10/12/20 1747 89     Resp 10/12/20 1747 18     Temp 10/12/20 1747 98.5 F (36.9 C)     Temp Source 10/12/20 1747 Oral     SpO2 10/12/20 1747 98 %     Weight 10/12/20 1748 170 lb (77.1 kg)     Height 10/12/20 1748 5\' 4"  (1.626 m)     Head Circumference --      Peak Flow --      Pain Score 10/12/20 1748 0     Pain Loc --      Pain Edu? --      Excl. in [place]? --     No data found.  Updated Vital Signs BP (!) 174/108 (BP Location: Right Arm)   Pulse 89   Temp 98.5 F (36.9 C) (Oral)   Resp 18   Ht 5\' 4"  (1.626 m)   Wt 77.1 kg   SpO2 98%   BMI 29.18 kg/m   Visual Acuity Right Eye Distance:   Left Eye Distance:   Bilateral Distance:    Right Eye Near:   Left Eye Near:    Bilateral Near:     Physical Exam Vitals and nursing note reviewed.  Constitutional:      Appearance: Normal appearance.  Cardiovascular:     Rate and Rhythm: Normal rate and regular rhythm.  Pulmonary:     Effort: Pulmonary effort is normal.     Breath sounds: Normal breath sounds.  Skin:    Comments: Rash on lower back macular papular with scabs and erythema consistent with folliculitis  Neurological:     Mental Status: She is alert.      UC Treatments /  Results  Labs (all labs ordered are listed, but only abnormal results are displayed) Labs Reviewed - No data to display  EKG   Radiology No results found.  Procedures Procedures (including critical care time)  Medications Ordered in UC Medications - No data to display  Initial Impression / Assessment and Plan / UC Course  I have reviewed the triage vital signs and the nursing notes.  Pertinent labs & imaging results that were available during my care of the patient were reviewed by me and considered in my medical decision making (see chart for details).     Folliculitis Final Clinical Impressions(s) / UC Diagnoses   Final diagnoses:  None   Discharge Instructions   None    ED Prescriptions    None     PDMP not reviewed this encounter.   Wardell Honour, MD 10/12/20 [phone]

## 2020-10-21 DIAGNOSIS — R9431 Abnormal electrocardiogram [ECG] [EKG]: Secondary | ICD-10-CM | POA: Insufficient documentation

## 2020-10-21 DIAGNOSIS — I10 Essential (primary) hypertension: Secondary | ICD-10-CM | POA: Insufficient documentation

## 2020-12-01 ENCOUNTER — Ambulatory Visit: Payer: Self-pay

## 2020-12-01 ENCOUNTER — Other Ambulatory Visit: Payer: Self-pay

## 2020-12-01 ENCOUNTER — Emergency Department
Admission: RE | Admit: 2020-12-01 | Discharge: 2020-12-01 | Disposition: A | Discharge status: Home / Self Care | Payer: BLUE CROSS/BLUE SHIELD | Source: Ambulatory Visit | Attending: Family Medicine | Admitting: Family Medicine

## 2020-12-01 VITALS — BP 164/108 | HR 97 | Temp 99.6°F | Resp 15 | Ht 64.0 in | Wt 169.8 lb

## 2020-12-01 DIAGNOSIS — L739 Follicular disorder, unspecified: Secondary | ICD-10-CM | POA: Diagnosis not present

## 2020-12-01 HISTORY — DX: Essential (primary) hypertension: I10

## 2020-12-01 MED ORDER — CEPHALEXIN 500 MG PO CAPS: 500.0000 mg | ORAL_CAPSULE | Freq: Two times a day (BID) | ORAL | 0 refills | Status: DC

## 2020-12-01 NOTE — ED Provider Notes (Signed)
[name]    CSN: [id number] Arrival date & time: 12/01/20  1729      History   Chief Complaint Chief Complaint  Patient presents with  . Rash    HPI Sonia Diaz is a 50 y.o. female.   Patient developed a pruritic rash on her back one week ago after visiting her gym.  She has had the same rash in the past diagnosed as folliculitis and resolving with Keflex.  She feels well otherwise.  The history is provided by the patient.  Rash Location:  Torso Torso rash location:  Upper back and lower back Quality: dryness, itchiness and redness   Quality: not blistering, not bruising, not burning, not draining, not painful, not peeling, not scaling, not swelling and not weeping   Severity:  Mild Onset quality:  Gradual Duration:  1 week Timing:  Constant Progression:  Worsening Chronicity:  Recurrent Context: not animal contact, not chemical exposure, not exposure to similar rash, not hot tub use, not insect bite/sting, not medications, not new detergent/soap, not nuts, not plant contact, not pregnancy, not sick contacts and not sun exposure   Relieved by:  None tried Worsened by:  Nothing Ineffective treatments:  None tried Associated symptoms: no abdominal pain, no diarrhea, no fatigue, no fever, no headaches, no induration, no joint pain, no myalgias, no nausea and no sore throat     Past Medical History:  Diagnosis Date  . Hypertension     Patient Active Problem List   Diagnosis Date Noted  . Essential hypertension 10/21/2020  . Prolonged Q-T interval on ECG 10/21/2020  . Vaginal discharge 07/11/2013    Past Surgical History:  Procedure Laterality Date  . CESAREAN SECTION     x 2     OB History   No obstetric history on file.      Home Medications    Prior to Admission medications   Medication Sig Start Date End Date Taking? Authorizing Provider  atenolol (TENORMIN) 25 MG tablet Take 25 mg by mouth daily. 11/27/20   [provider]   camphor-menthol Timoteo Ace) lotion Apply 1 application topically as needed for itching. 10/12/20   Wardell Honour, MD  cephALEXin (KEFLEX) 500 MG capsule Take 1 capsule (500 mg total) by mouth 2 (two) times daily. 12/01/20   Kandra Nicolas, MD  clotrimazole-betamethasone (LOTRISONE) cream Apply 1 application topically 2 (two) times daily. Patient not taking: Reported on 12/01/2020    [provider]    Family History Family History  Adopted: Yes    Social History Social History   Tobacco Use  . Smoking status: Never Smoker  . Smokeless tobacco: Never Used  Vaping Use  . Vaping Use: Never used  Substance Use Topics  . Alcohol use: Yes    Comment: 1 q wk  . Drug use: No     Allergies   Benadryl [diphenhydramine hcl], Diflucan [fluconazole], Sulfa antibiotics, and Lisinopril   Review of Systems Review of Systems  Constitutional: Negative for diaphoresis, fatigue and fever.  HENT: Negative for sore throat.   Gastrointestinal: Negative for abdominal pain, diarrhea and nausea.  Musculoskeletal: Negative for arthralgias and myalgias.  Skin: Positive for rash.  Neurological: Negative for headaches.  All other systems reviewed and are negative.    Physical Exam Triage Vital Signs ED Triage Vitals  Enc Vitals Group     BP 12/01/20 1745 (!) 164/108     Pulse Rate 12/01/20 1745 97     Resp 12/01/20  1745 15     Temp 12/01/20 1745 99.6 F (37.6 C)     Temp Source 12/01/20 1745 Oral     SpO2 12/01/20 1745 99 %     Weight 12/01/20 1746 169 lb 12.1 oz (77 kg)     Height 12/01/20 1746 5\' 4"  (1.626 m)     Head Circumference --      Peak Flow --      Pain Score 12/01/20 1748 0     Pain Loc --      Pain Edu? --      Excl. in [place]? --    No data found.  Updated Vital Signs BP (!) 164/108 (BP Location: Right Arm) Comment: pt on new med for HTN- has not started it - will start tonight  Pulse 97   Temp 99.6 F (37.6 C) (Oral)   Resp 15   Ht 5\' 4"  (1.626 m)   Wt  77 kg   LMP 11/27/2020 (Exact Date)   SpO2 99%   BMI 29.14 kg/m   Visual Acuity Right Eye Distance:   Left Eye Distance:   Bilateral Distance:    Right Eye Near:   Left Eye Near:    Bilateral Near:     Physical Exam Vitals and nursing note reviewed.  Constitutional:      General: She is not in acute distress. HENT:     Head: Normocephalic.     Right Ear: External ear normal.     Left Ear: External ear normal.     Nose: Nose normal.     Mouth/Throat:     Pharynx: Oropharynx is clear.  Cardiovascular:     Rate and Rhythm: Normal rate.  Pulmonary:     Effort: Pulmonary effort is normal.  Musculoskeletal:     Right lower leg: No edema.     Left lower leg: No edema.  Lymphadenopathy:     Cervical: No cervical adenopathy.  Skin:    General: Skin is warm and dry.     Findings: Rash present. Rash is scaling. Rash is not crusting, nodular, purpuric, pustular or vesicular.          Comments: On patient's posterior chest and lower back there are numerous follicular-based erythematous papules, without pustules.  Neurological:     Mental Status: She is alert and oriented to person, place, and time.      UC Treatments / Results  Labs (all labs ordered are listed, but only abnormal results are displayed) Labs Reviewed - No data to display  EKG   Radiology No results found.  Procedures Procedures (including critical care time)  Medications Ordered in UC Medications - No data to display  Initial Impression / Assessment and Plan / UC Course  I have reviewed the triage vital signs and the nursing notes.  Pertinent labs & imaging results that were available during my care of the patient were reviewed by me and considered in my medical decision making (see chart for details).    Begin Keflex (patient reports excellent response in the past) Followup with Family Doctor if not improved in one week.    Final Clinical Impressions(s) / UC Diagnoses   Final diagnoses:   Folliculitis     Discharge Instructions     May take Benadryl at bedtime if needed for itching.    ED Prescriptions    Medication Sig Dispense Auth. Provider   cephALEXin (KEFLEX) 500 MG capsule Take 1 capsule (500 mg total) by mouth 2 (two)  times daily. 20 capsule Kandra Nicolas, MD        Kandra Nicolas, MD 12/02/20 [phone]

## 2020-12-01 NOTE — ED Triage Notes (Addendum)
Rash started 1 week ago to back Pt has a hx of same rash in past  No new foods or detergent  Pt has not been in a tanning bed Was unable to see PCP until March 10

## 2020-12-01 NOTE — Discharge Instructions (Addendum)
May take Benadryl at bedtime if needed for itching. 

## 2021-12-09 ENCOUNTER — Ambulatory Visit: Payer: Self-pay

## 2021-12-09 ENCOUNTER — Other Ambulatory Visit: Payer: Self-pay

## 2021-12-09 ENCOUNTER — Ambulatory Visit (INDEPENDENT_AMBULATORY_CARE_PROVIDER_SITE_OTHER): Payer: BLUE CROSS/BLUE SHIELD | Admitting: Orthopedic Surgery

## 2021-12-09 DIAGNOSIS — M1812 Unilateral primary osteoarthritis of first carpometacarpal joint, left hand: Secondary | ICD-10-CM

## 2021-12-09 DIAGNOSIS — M79641 Pain in right hand: Secondary | ICD-10-CM | POA: Diagnosis not present

## 2021-12-09 DIAGNOSIS — M79642 Pain in left hand: Secondary | ICD-10-CM

## 2021-12-09 DIAGNOSIS — R202 Paresthesia of skin: Secondary | ICD-10-CM

## 2021-12-09 DIAGNOSIS — R200 Anesthesia of skin: Secondary | ICD-10-CM | POA: Diagnosis not present

## 2021-12-09 MED ORDER — MELOXICAM 7.5 MG PO TABS: 7.5000 mg | ORAL_TABLET | Freq: Every day | ORAL | 0 refills | Status: DC

## 2021-12-09 NOTE — Progress Notes (Signed)
Office Visit Note   Patient: Sonia Diaz           Date of Birth: 01/19/1971           MRN: [id number] Visit Date: 12/09/2021              Requested by: Hayden Rasmussen, MD [address] [place],   [address] PCP: Hayden Rasmussen, MD   Assessment & Plan: Visit Diagnoses:  1. Bilateral hand pain   2. Arthritis of carpometacarpal (CMC) joint of left thumb   3. Numbness and tingling in right hand     Plan: We discussed the nature of CMC arthritis and reviewed her x-rays which show very mild degenerative changes.  We discussed nonoperative management with oral or topical NSAIDs, bracing, hand therapy, corticosteroid injections.  We also discussed surgical treatment options.  We will start with an oral anti-inflammatory medication, hand therapy, and bracing.  Her right hand symptoms could be consistent with carpal tunnel syndrome.  We will get and EMG/NCS and I will see her back to discuss the results.   Follow-Up Instructions: No follow-ups on file.   Orders:  Orders Placed This Encounter  Procedures   XR Hand Complete Right   XR Hand Complete Left   No orders of the defined types were placed in this encounter.     Procedures: No procedures performed   Clinical Data: No additional findings.   Subjective: Chief Complaint  Patient presents with   Other     Bilateral hand pain    This is a 51 yo RHD F who presents with left basilar thumb pain and right hand numbness and tingling.  Her left basilar thumb pain has been present for several months now.  It is localized to the base of the thumb at the [facility] joint.  It is worse w/ prolonged activity. She wears a comfort cool neoprene sleeve and takes tylenol and ibuprofen.  Her right hand numbness and paresthesias have also been present for several months.  She wakes multiple nights per week with symptoms.  Her thumb, index, and middle fingers are involved.  Her small finger is not.  She has not tried night  splints.  She has never had electrodiagnostic studies done.  She has no history of DM, thyroid disease, c-spine disease or radiculopathy, wrist trauma, or inflammatory arthropathy.    Review of Systems   Objective: Vital Signs: There were no vitals taken for this visit.  Physical Exam Cardiovascular:     Rate and Rhythm: Normal rate.     Pulses: Normal pulses.  Pulmonary:     Effort: Pulmonary effort is normal.  Skin:    General: Skin is warm and dry.     Capillary Refill: Capillary refill takes less than 2 seconds.  Neurological:     Mental Status: She is alert.    Right Hand Exam   Tenderness  The patient is experiencing no tenderness.   Range of Motion  The patient has normal right wrist ROM.   Other  Erythema: absent Sensation: decreased Pulse: present  Comments:  + Tinel and equivocal Phalen signs.  4/5 thenar motor strength without atrophy.  Negative Tinel at elbow w/out ulnar nerve instability.    Left Hand Exam   Tenderness  Left hand tenderness location: TTP at base of thumb.   Range of Motion  The patient has normal left wrist ROM.  Muscle Strength  The patient has normal left wrist strength.  Other  Erythema: absent Sensation: normal Pulse: present  Comments:  + CMC grind test w/ pain but without crepitus.  Mild static MP hyper-extension. Negative finkelstein test w/ no pain at styloid.       Specialty Comments:  No specialty comments available.  Imaging: No results found.   PMFS History: Patient Active Problem List   Diagnosis Date Noted   Arthritis of carpometacarpal [facility]) joint of left thumb 12/09/2021   Numbness and tingling in right hand 12/09/2021   Essential hypertension 10/21/2020   Prolonged Q-T interval on ECG 10/21/2020   Vaginal discharge 07/11/2013   Past Medical History:  Diagnosis Date   Hypertension     Family History  Adopted: Yes    Past Surgical History:  Procedure Laterality Date   CESAREAN SECTION      x 2    Social History   Occupational History   Not on file  Tobacco Use   Smoking status: Never   Smokeless tobacco: Never  Vaping Use   Vaping Use: Never used  Substance and Sexual Activity   Alcohol use: Yes    Comment: 1 q wk   Drug use: No   Sexual activity: Not on file

## 2021-12-20 ENCOUNTER — Encounter: Payer: BLUE CROSS/BLUE SHIELD | Admitting: Rehabilitative and Restorative Service Providers"

## 2022-01-18 ENCOUNTER — Encounter: Payer: Self-pay | Admitting: Physical Medicine and Rehabilitation

## 2022-01-18 ENCOUNTER — Ambulatory Visit (INDEPENDENT_AMBULATORY_CARE_PROVIDER_SITE_OTHER): Payer: BLUE CROSS/BLUE SHIELD | Admitting: Physical Medicine and Rehabilitation

## 2022-01-18 DIAGNOSIS — R202 Paresthesia of skin: Secondary | ICD-10-CM | POA: Diagnosis not present

## 2022-01-18 NOTE — Progress Notes (Signed)
Pt state right hand numbness and pressure. Pt state she has numbness in the tips of her right thumb, index and middle finger. Pt state it hard for her to holding things and get dressed. Pt state the numbness keeps her up at night. Pt state she takes over the counter pain meds and sleep with a brace at nght. Pt state she right handed. ? ?Numeric Pain Rating Scale and Functional Assessment ?Average Pain 8 ? ? ?In the last MONTH (on 0-10 scale) has pain interfered with the following? ? ?1. General activity like being  able to carry out your everyday physical activities such as walking, climbing stairs, carrying groceries, or moving a chair?  ?Rating(10) ? ? ?-BT, -Dye Allergies. ? ?

## 2022-02-01 ENCOUNTER — Ambulatory Visit: Payer: BLUE CROSS/BLUE SHIELD | Admitting: Orthopedic Surgery

## 2022-02-01 DIAGNOSIS — G5601 Carpal tunnel syndrome, right upper limb: Secondary | ICD-10-CM | POA: Insufficient documentation

## 2022-02-01 NOTE — Procedures (Signed)
EMG & NCV Findings: ?Evaluation of the right median motor nerve showed prolonged distal onset latency (5.5 ms) and reduced amplitude (0.3 mV).  The right median (across palm) sensory nerve showed prolonged distal peak latency (Wrist, 5.5 ms), reduced amplitude (7.0 ?V), and prolonged distal peak latency (Palm, 4.8 ms).  All remaining nerves (as indicated in the following tables) were within normal limits.   ? ?Needle evaluation of the right abductor pollicis brevis muscle showed increased motor unit amplitude and diminished recruitment.  All remaining muscles (as indicated in the following table) showed no evidence of electrical instability.   ? ?Impression: ?The above electrodiagnostic study is ABNORMAL and reveals evidence of a severe right median nerve entrapment at the wrist (carpal tunnel syndrome) affecting sensory and motor components.  ? ?There is no significant electrodiagnostic evidence of any other focal nerve entrapment, brachial plexopathy or cervical radiculopathy.  ? ?Recommendations: ?1.  Follow-up with referring physician. ?2.  Continue current management of symptoms. ?3.  Suggest surgical evaluation. ? ?___________________________ ?Laurence Spates FAAPMR ?Board Certified, Tax adviser of Physical Medicine and Rehabilitation ? ? ? ?Nerve Conduction Studies ?Anti Sensory Summary Table ? ? Stim Site NR Peak (ms) Norm Peak (ms) P-T Amp (?V) Norm P-T Amp Site1 Site2 Delta-P (ms) Dist (cm) Vel (m/s) Norm Vel (m/s)  ?Right Median Acr Palm Anti Sensory (2nd Digit)  30.5?C  ?Wrist    *5.5 <3.6 *7.0 >10 Wrist Palm 0.7 0.0    ?Palm    *4.8 <2.0 13.6         ?Right Radial Anti Sensory (Base 1st Digit)  31.1?C  ?Wrist    2.0 <3.1 28.8  Wrist Base 1st Digit 2.0 0.0    ?Right Ulnar Anti Sensory (5th Digit)  31.3?C  ?Wrist    2.9 <3.7 15.0 >15.0 Wrist 5th Digit 2.9 14.0 48 >38  ? ?Motor Summary Table ? ? Stim Site NR Onset (ms) Norm Onset (ms) O-P Amp (mV) Norm O-P Amp Site1 Site2 Delta-0 (ms) Dist (cm) Vel (m/s)  Norm Vel (m/s)  ?Right Median Motor (Abd Poll Brev)  31.5?C  ?Wrist    *5.5 <4.2 *0.3 >5 Elbow Wrist 3.4 18.0 53 >50  ?Elbow    8.9  3.0  Axilla Elbow 0.5 0.0    ?Axilla    9.4  0.6         ?Right Ulnar Motor (Abd Dig Min)  31.5?C  ?Wrist    3.0 <4.2 10.0 >3 B Elbow Wrist 3.3 20.0 61 >53  ?B Elbow    6.3  8.4  A Elbow B Elbow 1.3 10.0 77 >53  ?A Elbow    7.6  8.6         ? ?EMG ? ? Side Muscle Nerve Root Ins Act Fibs Psw Amp Dur Poly Recrt Int Fraser Din Comment  ?Right Abd Poll Brev Median C8-T1 Nml Nml Nml *Incr Nml 0 *Reduced Nml   ?Right 1stDorInt Ulnar C8-T1 Nml Nml Nml Nml Nml 0 Nml Nml   ?Right PronatorTeres Median C6-7 Nml Nml Nml Nml Nml 0 Nml Nml   ?Right Biceps Musculocut C5-6 Nml Nml Nml Nml Nml 0 Nml Nml   ?Right Deltoid Axillary C5-6 Nml Nml Nml Nml Nml 0 Nml Nml   ? ? ?Nerve Conduction Studies ?Anti Sensory Left/Right Comparison ? ? Stim Site L Lat (ms) R Lat (ms) L-R Lat (ms) L Amp (?V) R Amp (?V) L-R Amp (%) Site1 Site2 L Vel (m/s) R Vel (m/s) L-R Vel (m/s)  ?Median Acr  Palm Anti Sensory (2nd Digit)  30.5?C  ?Wrist  *5.5   *7.0  Wrist Palm     ?Palm  *4.8   13.6        ?Radial Anti Sensory (Base 1st Digit)  31.1?C  ?Wrist  2.0   28.8  Wrist Base 1st Digit     ?Ulnar Anti Sensory (5th Digit)  31.3?C  ?Wrist  2.9   15.0  Wrist 5th Digit  48   ? ?Motor Left/Right Comparison ? ? Stim Site L Lat (ms) R Lat (ms) L-R Lat (ms) L Amp (mV) R Amp (mV) L-R Amp (%) Site1 Site2 L Vel (m/s) R Vel (m/s) L-R Vel (m/s)  ?Median Motor (Abd Poll Brev)  31.5?C  ?Wrist  *5.5   *0.3  Elbow Wrist  53   ?Elbow  8.9   3.0  Axilla Elbow     ?Axilla  9.4   0.6        ?Ulnar Motor (Abd Dig Min)  31.5?C  ?Wrist  3.0   10.0  B Elbow Wrist  61   ?B Elbow  6.3   8.4  A Elbow B Elbow  77   ?A Elbow  7.6   8.6        ? ? ? ?Waveforms: ?    ? ?   ? ? ?

## 2022-02-01 NOTE — Progress Notes (Signed)
? ?Sonia Diaz - 51 y.o. female MRN [id number]  Date of birth: Mar 21, 1971 ? ?Office Visit Note: ?Visit Date: 01/18/2022 ?PCP: Hayden Rasmussen, MD ?Referred by: Sherilyn Cooter, MD ? ?Subjective: ?Chief Complaint  ?Patient presents with  ? Right Hand - Numbness, Weakness  ? ?HPI:  Sonia Diaz is a 51 y.o. female who comes in today at the request of Dr. Sherilyn Cooter for electrodiagnostic study of the Right upper extremities.  Patient is Right hand dominant.  She reports chronic issues of some numbness and tingling in the hand but several months of consistent persistent right numbness and tingling particularly at night in the index middle and ring finger.  She has been wearing wrist splints without much relief.  No prior electrodiagnostic studies.  No radicular symptoms. ? ?ROS Otherwise per HPI. ? ?Assessment & Plan: ?Visit Diagnoses:  ?  ICD-10-CM   ?1. Paresthesia of skin  R20.2 NCV with EMG (electromyography)  ?  ?  ?Plan: Impression: ?The above electrodiagnostic study is ABNORMAL and reveals evidence of a severe right median nerve entrapment at the wrist (carpal tunnel syndrome) affecting sensory and motor components.  ? ?There is no significant electrodiagnostic evidence of any other focal nerve entrapment, brachial plexopathy or cervical radiculopathy.  ? ?Recommendations: ?1.  Follow-up with referring physician. ?2.  Continue current management of symptoms. ?3.  Suggest surgical evaluation. ? ?Meds & Orders: No orders of the defined types were placed in this encounter. ?  ?Orders Placed This Encounter  ?Procedures  ? NCV with EMG (electromyography)  ?  ?Follow-up: Return in about 2 weeks (around 02/01/2022) for Sherilyn Cooter, MD.  ? ?Procedures: ?No procedures performed  ?EMG & NCV Findings: ?Evaluation of the right median motor nerve showed prolonged distal onset latency (5.5 ms) and reduced amplitude (0.3 mV).  The right median (across palm) sensory nerve showed prolonged distal peak latency  (Wrist, 5.5 ms), reduced amplitude (7.0 ?V), and prolonged distal peak latency (Palm, 4.8 ms).  All remaining nerves (as indicated in the following tables) were within normal limits.   ? ?Needle evaluation of the right abductor pollicis brevis muscle showed increased motor unit amplitude and diminished recruitment.  All remaining muscles (as indicated in the following table) showed no evidence of electrical instability.   ? ?Impression: ?The above electrodiagnostic study is ABNORMAL and reveals evidence of a severe right median nerve entrapment at the wrist (carpal tunnel syndrome) affecting sensory and motor components.  ? ?There is no significant electrodiagnostic evidence of any other focal nerve entrapment, brachial plexopathy or cervical radiculopathy.  ? ?Recommendations: ?1.  Follow-up with referring physician. ?2.  Continue current management of symptoms. ?3.  Suggest surgical evaluation. ? ?___________________________ ?Laurence Spates FAAPMR ?Board Certified, Tax adviser of Physical Medicine and Rehabilitation ? ? ? ?Nerve Conduction Studies ?Anti Sensory Summary Table ? ? Stim Site NR Peak (ms) Norm Peak (ms) P-T Amp (?V) Norm P-T Amp Site1 Site2 Delta-P (ms) Dist (cm) Vel (m/s) Norm Vel (m/s)  ?Right Median Acr Palm Anti Sensory (2nd Digit)  30.5?C  ?Wrist    *5.5 <3.6 *7.0 >10 Wrist Palm 0.7 0.0    ?Palm    *4.8 <2.0 13.6         ?Right Radial Anti Sensory (Base 1st Digit)  31.1?C  ?Wrist    2.0 <3.1 28.8  Wrist Base 1st Digit 2.0 0.0    ?Right Ulnar Anti Sensory (5th Digit)  31.3?C  ?Wrist    2.9 <3.7 15.0 >15.0 Wrist 5th  Digit 2.9 14.0 48 >38  ? ?Motor Summary Table ? ? Stim Site NR Onset (ms) Norm Onset (ms) O-P Amp (mV) Norm O-P Amp Site1 Site2 Delta-0 (ms) Dist (cm) Vel (m/s) Norm Vel (m/s)  ?Right Median Motor (Abd Poll Brev)  31.5?C  ?Wrist    *5.5 <4.2 *0.3 >5 Elbow Wrist 3.4 18.0 53 >50  ?Elbow    8.9  3.0  Axilla Elbow 0.5 0.0    ?Axilla    9.4  0.6         ?Right Ulnar Motor (Abd Dig Min)   31.5?C  ?Wrist    3.0 <4.2 10.0 >3 B Elbow Wrist 3.3 20.0 61 >53  ?B Elbow    6.3  8.4  A Elbow B Elbow 1.3 10.0 77 >53  ?A Elbow    7.6  8.6         ? ?EMG ? ? Side Muscle Nerve Root Ins Act Fibs Psw Amp Dur Poly Recrt Int Fraser Din Comment  ?Right Abd Poll Brev Median C8-T1 Nml Nml Nml *Incr Nml 0 *Reduced Nml   ?Right 1stDorInt Ulnar C8-T1 Nml Nml Nml Nml Nml 0 Nml Nml   ?Right PronatorTeres Median C6-7 Nml Nml Nml Nml Nml 0 Nml Nml   ?Right Biceps Musculocut C5-6 Nml Nml Nml Nml Nml 0 Nml Nml   ?Right Deltoid Axillary C5-6 Nml Nml Nml Nml Nml 0 Nml Nml   ? ? ?Nerve Conduction Studies ?Anti Sensory Left/Right Comparison ? ? Stim Site L Lat (ms) R Lat (ms) L-R Lat (ms) L Amp (?V) R Amp (?V) L-R Amp (%) Site1 Site2 L Vel (m/s) R Vel (m/s) L-R Vel (m/s)  ?Median Acr Palm Anti Sensory (2nd Digit)  30.5?C  ?Wrist  *5.5   *7.0  Wrist Palm     ?Palm  *4.8   13.6        ?Radial Anti Sensory (Base 1st Digit)  31.1?C  ?Wrist  2.0   28.8  Wrist Base 1st Digit     ?Ulnar Anti Sensory (5th Digit)  31.3?C  ?Wrist  2.9   15.0  Wrist 5th Digit  48   ? ?Motor Left/Right Comparison ? ? Stim Site L Lat (ms) R Lat (ms) L-R Lat (ms) L Amp (mV) R Amp (mV) L-R Amp (%) Site1 Site2 L Vel (m/s) R Vel (m/s) L-R Vel (m/s)  ?Median Motor (Abd Poll Brev)  31.5?C  ?Wrist  *5.5   *0.3  Elbow Wrist  53   ?Elbow  8.9   3.0  Axilla Elbow     ?Axilla  9.4   0.6        ?Ulnar Motor (Abd Dig Min)  31.5?C  ?Wrist  3.0   10.0  B Elbow Wrist  61   ?B Elbow  6.3   8.4  A Elbow B Elbow  77   ?A Elbow  7.6   8.6        ? ? ? ?Waveforms: ?    ? ?   ? ?  ? ?Clinical History: ?No specialty comments available.  ? ? ? ?Objective:  VS:  HT:    WT:   BMI:     BP:   HR: bpm  TEMP: ( )  RESP:  ?Physical Exam ?Musculoskeletal:     ?   General: No swelling, tenderness or deformity.  ?   Comments: Inspection reveals flattening of the right APB but no atrophy of the bilateral APB or FDI or hand intrinsics.  There is no swelling, color changes, allodynia or dystrophic  changes. There is 5 out of 5 strength in the bilateral wrist extension, finger abduction and long finger flexion. There is impaired sensation to light touch in right median nerve distribution. There is a negative Hoffmann's test bilaterally.  ?Skin: ?   General: Skin is warm and dry.  ?   Findings: No erythema or rash.  ?Neurological:  ?   General: No focal deficit present.  ?   Mental Status: She is alert and oriented to person, place, and time.  ?   Motor: No weakness or abnormal muscle tone.  ?   Coordination: Coordination normal.  ?Psychiatric:     ?   Mood and Affect: Mood normal.     ?   Behavior: Behavior normal.  ?  ? ?Imaging: ?No results found. ?

## 2022-02-01 NOTE — H&P (View-Only) (Signed)
? ?Office Visit Note ?  ?Patient: Sonia Diaz           ?Date of Birth: 04/02/1971           ?MRN: [id number] ?Visit Date: 02/01/2022 ?             ?Requested by: Hayden Rasmussen, MD ?[facility] ?[address] ?[place],  [place] [address] ?PCP: Hayden Rasmussen, MD ? ? ?Assessment & Plan: ?Visit Diagnoses:  ?1. Carpal tunnel syndrome, right upper limb   ? ? ?Plan: We reviewed patient's EMG/nerve conduction study which suggested severe right carpal tunnel syndrome.  Her symptoms are unchanged.  We again discussed treatment options for carpal tunnel syndrome.  She would like to proceed with carpal tunnel release.  We discussed the risks of surgery including but not limited to bleeding, infection, damage to neurovascular structures, persistent symptoms need for additional surgery.  Patient would like to proceed with open carpal tunnel release.  A surgical date will be confirmed with the patient. ? ?Follow-Up Instructions: No follow-ups on file.  ? ?Orders:  ?No orders of the defined types were placed in this encounter. ? ?No orders of the defined types were placed in this encounter. ? ? ? ? Procedures: ?No procedures performed ? ? ?Clinical Data: ?No additional findings. ? ? ?Subjective: ?Chief Complaint  ?Patient presents with  ? Right Hand - Follow-up  ?  EMG/NCS Reiew  ? ? ?This is a 51 yo RHD F who presents with contiued right hand numbness and tingling.  Her right hand numbness and paresthesias have  been present for several months.  She wakes multiple nights per week with symptoms.  Her thumb, index, and middle fingers are involved.  Her small finger is not.  She recently underwent EMG/nerve conduction study which suggested severe right carpal tunnel syndrome.  She is interested in discussing surgical management. ? ? ?Review of Systems ? ? ?Objective: ?Vital Signs: There were no vitals taken for this visit. ? ?Physical Exam ?Constitutional:   ?   Appearance: Normal appearance.  ?Cardiovascular:  ?   Rate and  Rhythm: Normal rate.  ?   Pulses: Normal pulses.  ?Pulmonary:  ?   Effort: Pulmonary effort is normal.  ?Skin: ?   General: Skin is warm and dry.  ?Neurological:  ?   Mental Status: She is alert.  ? ? ?Right Hand Exam  ? ?Tenderness  ?The patient is experiencing no tenderness.  ? ?Range of Motion  ?The patient has normal right wrist ROM.  ? ?Other  ?Erythema: absent ?Sensation: normal ?Pulse: present ? ?Comments:  Positive Tinel and Phalen signs.  5/5 thenar motor strength without atrophy.  ? ? ? ? ?Specialty Comments:  ?No specialty comments available. ? ?Imaging: ?No results found. ? ? ?PMFS History: ?Patient Active Problem List  ? Diagnosis Date Noted  ? Carpal tunnel syndrome, right upper limb 02/01/2022  ? Arthritis of carpometacarpal [facility]) joint of left thumb 12/09/2021  ? Numbness and tingling in right hand 12/09/2021  ? Essential hypertension 10/21/2020  ? Prolonged Q-T interval on ECG 10/21/2020  ? Vaginal discharge 07/11/2013  ? ?Past Medical History:  ?Diagnosis Date  ? Hypertension   ?  ?Family History  ?Adopted: Yes  ?  ?Past Surgical History:  ?Procedure Laterality Date  ? CESAREAN SECTION    ? x 2   ? ?Social History  ? ?Occupational History  ? Not on file  ?Tobacco Use  ? Smoking status: Never  ?  Smokeless tobacco: Never  ?Vaping Use  ? Vaping Use: Never used  ?Substance and Sexual Activity  ? Alcohol use: Yes  ?  Comment: 1 q wk  ? Drug use: No  ? Sexual activity: Not on file  ? ? ? ? ? ? ?

## 2022-02-01 NOTE — Progress Notes (Signed)
? ?Office Visit Note ?  ?Patient: Sonia Diaz           ?Date of Birth: [birth year]           ?MRN: [id number] ?Visit Date: 02/01/2022 ?             ?Requested by: Hayden Rasmussen, MD ?[facility] ?[address] ?[place],   [address] ?PCP: Hayden Rasmussen, MD ? ? ?Assessment & Plan: ?Visit Diagnoses:  ?1. Carpal tunnel syndrome, right upper limb   ? ? ?Plan: We reviewed patient's EMG/nerve conduction study which suggested severe right carpal tunnel syndrome.  Her symptoms are unchanged.  We again discussed treatment options for carpal tunnel syndrome.  She would like to proceed with carpal tunnel release.  We discussed the risks of surgery including but not limited to bleeding, infection, damage to neurovascular structures, persistent symptoms need for additional surgery.  Patient would like to proceed with open carpal tunnel release.  A surgical date will be confirmed with the patient. ? ?Follow-Up Instructions: No follow-ups on file.  ? ?Orders:  ?No orders of the defined types were placed in this encounter. ? ?No orders of the defined types were placed in this encounter. ? ? ? ? Procedures: ?No procedures performed ? ? ?Clinical Data: ?No additional findings. ? ? ?Subjective: ?Chief Complaint  ?Patient presents with  ? Right Hand - Follow-up  ?  EMG/NCS Reiew  ? ? ?This is a 51 yo RHD F who presents with contiued right hand numbness and tingling.  Her right hand numbness and paresthesias have  been present for several months.  She wakes multiple nights per week with symptoms.  Her thumb, index, and middle fingers are involved.  Her small finger is not.  She recently underwent EMG/nerve conduction study which suggested severe right carpal tunnel syndrome.  She is interested in discussing surgical management. ? ? ?Review of Systems ? ? ?Objective: ?Vital Signs: There were no vitals taken for this visit. ? ?Physical Exam ?Constitutional:   ?   Appearance: Normal appearance.  ?Cardiovascular:  ?   Rate and  Rhythm: Normal rate.  ?   Pulses: Normal pulses.  ?Pulmonary:  ?   Effort: Pulmonary effort is normal.  ?Skin: ?   General: Skin is warm and dry.  ?Neurological:  ?   Mental Status: She is alert.  ? ? ?Right Hand Exam  ? ?Tenderness  ?The patient is experiencing no tenderness.  ? ?Range of Motion  ?The patient has normal right wrist ROM.  ? ?Other  ?Erythema: absent ?Sensation: normal ?Pulse: present ? ?Comments:  Positive Tinel and Phalen signs.  5/5 thenar motor strength without atrophy.  ? ? ? ? ?Specialty Comments:  ?No specialty comments available. ? ?Imaging: ?No results found. ? ? ?PMFS History: ?Patient Active Problem List  ? Diagnosis Date Noted  ? Carpal tunnel syndrome, right upper limb 02/01/2022  ? Arthritis of carpometacarpal [facility]) joint of left thumb 12/09/2021  ? Numbness and tingling in right hand 12/09/2021  ? Essential hypertension 10/21/2020  ? Prolonged Q-T interval on ECG 10/21/2020  ? Vaginal discharge 07/11/2013  ? ?Past Medical History:  ?Diagnosis Date  ? Hypertension   ?  ?Family History  ?Adopted: Yes  ?  ?Past Surgical History:  ?Procedure Laterality Date  ? CESAREAN SECTION    ? x 2   ? ?Social History  ? ?Occupational History  ? Not on file  ?Tobacco Use  ? Smoking status: Never  ?  Smokeless tobacco: Never  ?Vaping Use  ? Vaping Use: Never used  ?Substance and Sexual Activity  ? Alcohol use: Yes  ?  Comment: 1 q wk  ? Drug use: No  ? Sexual activity: Not on file  ? ? ? ? ? ? ?

## 2022-02-03 ENCOUNTER — Encounter (HOSPITAL_BASED_OUTPATIENT_CLINIC_OR_DEPARTMENT_OTHER): Payer: Self-pay | Admitting: Orthopedic Surgery

## 2022-02-03 ENCOUNTER — Other Ambulatory Visit: Payer: Self-pay

## 2022-02-14 ENCOUNTER — Encounter (HOSPITAL_BASED_OUTPATIENT_CLINIC_OR_DEPARTMENT_OTHER)
Admission: RE | Disposition: A | Discharge status: Home / Self Care | Payer: Self-pay | Source: Home / Self Care | Attending: Orthopedic Surgery

## 2022-02-14 ENCOUNTER — Ambulatory Visit (HOSPITAL_BASED_OUTPATIENT_CLINIC_OR_DEPARTMENT_OTHER)
Admission: RE | Admit: 2022-02-14 | Discharge: 2022-02-14 | Disposition: A | Discharge status: Home / Self Care | Payer: BLUE CROSS/BLUE SHIELD | Attending: Orthopedic Surgery | Admitting: Orthopedic Surgery

## 2022-02-14 ENCOUNTER — Encounter (HOSPITAL_BASED_OUTPATIENT_CLINIC_OR_DEPARTMENT_OTHER): Payer: Self-pay | Admitting: Orthopedic Surgery

## 2022-02-14 ENCOUNTER — Other Ambulatory Visit: Payer: Self-pay

## 2022-02-14 ENCOUNTER — Ambulatory Visit (HOSPITAL_BASED_OUTPATIENT_CLINIC_OR_DEPARTMENT_OTHER): Payer: BLUE CROSS/BLUE SHIELD | Admitting: Certified Registered Nurse Anesthetist

## 2022-02-14 DIAGNOSIS — I10 Essential (primary) hypertension: Secondary | ICD-10-CM | POA: Insufficient documentation

## 2022-02-14 DIAGNOSIS — G5601 Carpal tunnel syndrome, right upper limb: Secondary | ICD-10-CM | POA: Diagnosis not present

## 2022-02-14 HISTORY — PX: CARPAL TUNNEL RELEASE: SHX101

## 2022-02-14 LAB — POCT PREGNANCY, URINE: Preg Test, Ur: NEGATIVE

## 2022-02-14 SURGERY — CARPAL TUNNEL RELEASE
Anesthesia: Monitor Anesthesia Care | Site: Wrist | Laterality: Right

## 2022-02-14 MED ORDER — 0.9 % SODIUM CHLORIDE (POUR BTL) OPTIME
TOPICAL | Status: DC | PRN
  Administered 2022-02-14: 60 mL

## 2022-02-14 MED ORDER — ONDANSETRON HCL 4 MG/2ML IJ SOLN
INTRAMUSCULAR | Status: DC | PRN
  Administered 2022-02-14: 4 mg via INTRAVENOUS

## 2022-02-14 MED ORDER — OXYCODONE HCL 5 MG PO TABS: 5.0000 mg | ORAL_TABLET | Freq: Once | ORAL | Status: DC | PRN

## 2022-02-14 MED ORDER — BUPIVACAINE HCL (PF) 0.25 % IJ SOLN
INTRAMUSCULAR | Status: DC | PRN
  Administered 2022-02-14: 10 mL

## 2022-02-14 MED ORDER — MIDAZOLAM HCL 5 MG/5ML IJ SOLN
INTRAMUSCULAR | Status: DC | PRN
  Administered 2022-02-14: 2 mg via INTRAVENOUS

## 2022-02-14 MED ORDER — PROPOFOL 10 MG/ML IV BOLUS
INTRAVENOUS | Status: DC | PRN
  Administered 2022-02-14: 50 mg via INTRAVENOUS
  Administered 2022-02-14: 10 mg via INTRAVENOUS

## 2022-02-14 MED ORDER — FENTANYL CITRATE (PF) 100 MCG/2ML IJ SOLN: 25.0000 ug | INTRAMUSCULAR | Status: DC | PRN

## 2022-02-14 MED ORDER — FENTANYL CITRATE (PF) 100 MCG/2ML IJ SOLN
INTRAMUSCULAR | Status: AC
  Filled 2022-02-14: qty 2

## 2022-02-14 MED ORDER — ACETAMINOPHEN 160 MG/5ML PO SOLN: 325.0000 mg | ORAL | Status: DC | PRN

## 2022-02-14 MED ORDER — LIDOCAINE 2% (20 MG/ML) 5 ML SYRINGE
INTRAMUSCULAR | Status: DC | PRN
  Administered 2022-02-14: 40 mg via INTRAVENOUS

## 2022-02-14 MED ORDER — LIDOCAINE HCL (PF) 0.5 % IJ SOLN
INTRAMUSCULAR | Status: AC
  Filled 2022-02-14: qty 50

## 2022-02-14 MED ORDER — AMISULPRIDE (ANTIEMETIC) 5 MG/2ML IV SOLN: 10.0000 mg | Freq: Once | INTRAVENOUS | Status: DC | PRN

## 2022-02-14 MED ORDER — ACETAMINOPHEN 10 MG/ML IV SOLN: 1000.0000 mg | Freq: Once | INTRAVENOUS | Status: DC | PRN

## 2022-02-14 MED ORDER — LACTATED RINGERS IV SOLN: INTRAVENOUS | Status: DC

## 2022-02-14 MED ORDER — LIDOCAINE 2% (20 MG/ML) 5 ML SYRINGE
INTRAMUSCULAR | Status: AC
  Filled 2022-02-14: qty 5

## 2022-02-14 MED ORDER — MIDAZOLAM HCL 2 MG/2ML IJ SOLN
INTRAMUSCULAR | Status: AC
  Filled 2022-02-14: qty 2

## 2022-02-14 MED ORDER — FENTANYL CITRATE (PF) 100 MCG/2ML IJ SOLN
INTRAMUSCULAR | Status: DC | PRN
  Administered 2022-02-14: 50 ug via INTRAVENOUS
  Administered 2022-02-14 (×2): 25 ug via INTRAVENOUS

## 2022-02-14 MED ORDER — PROPOFOL 500 MG/50ML IV EMUL
INTRAVENOUS | Status: DC | PRN
Start: 2022-02-14 — End: 2022-02-14
  Administered 2022-02-14: 100 ug/kg/min via INTRAVENOUS

## 2022-02-14 MED ORDER — ACETAMINOPHEN 325 MG PO TABS: 325.0000 mg | ORAL_TABLET | ORAL | Status: DC | PRN

## 2022-02-14 MED ORDER — OXYCODONE HCL 5 MG/5ML PO SOLN: 5.0000 mg | Freq: Once | ORAL | Status: DC | PRN

## 2022-02-14 SURGICAL SUPPLY — 42 items
APL PRP STRL LF DISP 70% ISPRP (MISCELLANEOUS) ×1
BLADE SURG 15 STRL LF DISP TIS (BLADE) ×2 IMPLANT
BLADE SURG 15 STRL SS (BLADE) ×2
BNDG CMPR 9X4 STRL LF SNTH (GAUZE/BANDAGES/DRESSINGS) ×1
BNDG ELASTIC 3X5.8 VLCR STR LF (GAUZE/BANDAGES/DRESSINGS) ×3 IMPLANT
BNDG ESMARK 4X9 LF (GAUZE/BANDAGES/DRESSINGS) ×3 IMPLANT
BNDG GAUZE ELAST 4 BULKY (GAUZE/BANDAGES/DRESSINGS) ×3 IMPLANT
BNDG PLASTER X FAST 3X3 WHT LF (CAST SUPPLIES) IMPLANT
BNDG PLSTR 9X3 FST ST WHT (CAST SUPPLIES)
CHLORAPREP W/TINT 26 (MISCELLANEOUS) ×3 IMPLANT
CORD BIPOLAR FORCEPS 12FT (ELECTRODE) ×3 IMPLANT
COVER BACK TABLE 60X90IN (DRAPES) ×3 IMPLANT
COVER MAYO STAND STRL (DRAPES) ×3 IMPLANT
CUFF TOURN SGL QUICK 18X4 (TOURNIQUET CUFF) IMPLANT
CUFF TOURN SGL QUICK 24 (TOURNIQUET CUFF)
CUFF TRNQT CYL 24X4X16.5-23 (TOURNIQUET CUFF) IMPLANT
DRAPE EXTREMITY T 121X128X90 (DISPOSABLE) ×3 IMPLANT
DRAPE SURG 17X23 STRL (DRAPES) ×3 IMPLANT
GAUZE XEROFORM 1X8 LF (GAUZE/BANDAGES/DRESSINGS) ×1 IMPLANT
GLOVE BIO SURGEON STRL SZ7 (GLOVE) ×3 IMPLANT
GLOVE BIOGEL PI IND STRL 7.0 (GLOVE) ×2 IMPLANT
GLOVE BIOGEL PI INDICATOR 7.0 (GLOVE) ×1
GOWN STRL REUS W/ TWL LRG LVL3 (GOWN DISPOSABLE) ×2 IMPLANT
GOWN STRL REUS W/TWL LRG LVL3 (GOWN DISPOSABLE) ×2
GOWN STRL REUS W/TWL XL LVL3 (GOWN DISPOSABLE) ×3 IMPLANT
NDL HYPO 25X1 1.5 SAFETY (NEEDLE) IMPLANT
NEEDLE HYPO 25X1 1.5 SAFETY (NEEDLE) IMPLANT
NS IRRIG 1000ML POUR BTL (IV SOLUTION) ×3 IMPLANT
PACK BASIN DAY SURGERY FS (CUSTOM PROCEDURE TRAY) ×3 IMPLANT
PAD CAST 3X4 CTTN HI CHSV (CAST SUPPLIES) ×2 IMPLANT
PADDING CAST COTTON 3X4 STRL (CAST SUPPLIES) ×2
SLEEVE SCD COMPRESS KNEE MED (STOCKING) IMPLANT
SUCTION FRAZIER HANDLE 10FR (MISCELLANEOUS)
SUCTION TUBE FRAZIER 10FR DISP (MISCELLANEOUS) IMPLANT
SUT ETHILON 4 0 PS 2 18 (SUTURE) ×3 IMPLANT
SUT MNCRL AB 3-0 PS2 18 (SUTURE) IMPLANT
SUT VICRYL 4-0 PS2 18IN ABS (SUTURE) IMPLANT
SYR BULB EAR ULCER 3OZ GRN STR (SYRINGE) ×3 IMPLANT
SYR CONTROL 10ML LL (SYRINGE) IMPLANT
TOWEL GREEN STERILE FF (TOWEL DISPOSABLE) ×6 IMPLANT
TUBE CONNECTING 20X1/4 (TUBING) IMPLANT
UNDERPAD 30X36 HEAVY ABSORB (UNDERPADS AND DIAPERS) ×3 IMPLANT

## 2022-02-14 NOTE — Interval H&P Note (Signed)
History and Physical Interval Note: ? ?02/14/2022 ?12:13 PM ? ?Sonia Diaz  has presented today for surgery, with the diagnosis of RIGHT CARPAL TUNNEL SYNDROME.  The various methods of treatment have been discussed with the patient and family. After consideration of risks, benefits and other options for treatment, the patient has consented to  Procedure(s): ?RIGHT CARPAL TUNNEL RELEASE (Right) as a surgical intervention.  The patient's history has been reviewed, patient examined, no change in status, stable for surgery.  I have reviewed the patient's chart and labs.  Questions were answered to the patient's satisfaction.   ? ? ?Perl Kerney Aeden Matranga ? ? ?

## 2022-02-14 NOTE — Anesthesia Postprocedure Evaluation (Signed)
Anesthesia Post Note ? ?Patient: MOLINA HOLLENBACK ? ?Procedure(s) Performed: RIGHT CARPAL TUNNEL RELEASE (Right: Wrist) ? ?  ? ?Patient location during evaluation: PACU ?Anesthesia Type: MAC ?Level of consciousness: awake and alert ?Pain management: pain level controlled ?Vital Signs Assessment: post-procedure vital signs reviewed and stable ?Respiratory status: spontaneous breathing, nonlabored ventilation, respiratory function stable and patient connected to nasal cannula oxygen ?Cardiovascular status: stable and blood pressure returned to baseline ?Postop Assessment: no apparent nausea or vomiting ?Anesthetic complications: no ? ? ?No notable events documented. ? ?Last Vitals:  ?Vitals:  ? 02/14/22 1117 02/14/22 1300  ?BP: (!) 153/99 139/82  ?Pulse: 84 63  ?Resp: 16 18  ?Temp: 36.7 ?C 36.4 ?C  ?SpO2: 100% 96%  ?  ?Last Pain:  ?Vitals:  ? 02/14/22 1315  ?TempSrc:   ?PainSc: 0-No pain  ? ? ?  ?  ?  ?  ?  ?  ? ?Effie Berkshire ? ? ? ? ?

## 2022-02-14 NOTE — Discharge Instructions (Addendum)
 Sonia Diaz, M.D. Hand Surgery  POST-OPERATIVE DISCHARGE INSTRUCTIONS   PRESCRIPTIONS: - You may have been given a prescription to be taken as directed for post-operative pain control.  You may also take over the counter ibuprofen/aleve and tylenol for pain. Take this as directed on the packaging. Do not exceed 3000 mg tylenol/acetaminophen in 24 hours.  Ibuprofen 600-800 mg (3-4) tablets by mouth every 6 hours as needed for pain.   OR  Aleve 2 tablets by mouth every 12 hours (twice daily) as needed for pain.   AND/OR  Tylenol 1000 mg (2 tablets) every 8 hours as needed for pain.  - Please use your pain medication carefully, as refills are limited and you may not be provided with one.  As stated above, please use over the counter pain medicine - it will also be helpful with decreasing your swelling.    ANESTHESIA: -After your surgery, post-surgical discomfort or pain is likely. This discomfort can last several days to a few weeks. At certain times of the day your discomfort may be more intense.   Did you receive a nerve block?   - A nerve block can provide pain relief for one hour to two days after your surgery. As long as the nerve block is working, you will experience little or no sensation in the area the surgeon operated on.  - As the nerve block wears off, you will begin to experience pain or discomfort. It is very important that you begin taking your prescribed pain medication before the nerve block fully wears off. Treating your pain at the first sign of the block wearing off will ensure your pain is better controlled and more tolerable when full-sensation returns. Do not wait until the pain is intolerable, as the medicine will be less effective. It is better to treat pain in advance than to try and catch up.   General Anesthesia:  If you did not receive a nerve block during your surgery, you will need to start taking your pain medication shortly after your surgery and  should continue to do so as prescribed by your surgeon.     ICE AND ELEVATION: - You may use ice for the first 48-72 hours, but it is not critical.   - Motion of your fingers is very important to decrease the swelling.  - Elevation, as much as possible for the next 48 hours, is critical for decreasing swelling as well as for pain relief. Elevation means when you are seated or lying down, you hand should be at or above your heart. When walking, the hand needs to be at or above the level of your elbow.  - If the bandage gets too tight, it may need to be loosened. Please contact our office and we will instruct you in how to do this.    SURGICAL BANDAGES:  - Keep your dressing and/or splint clean and dry at all times.  You can remove your dressing 4 days from now and change with a dry dressing or Band-Aids as needed thereafter. - You may place a plastic bag over your bandage to shower, but be careful, do not get your bandages wet.  - After the bandages have been removed, it is OK to get the stitches wet in a shower or with hand washing. Do Not soak or submerge the wound yet. Please do not use lotions or creams on the stitches.      HAND THERAPY:  - You may not need any. If you   do, we will begin this at your follow up visit in the clinic.    ACTIVITY AND WORK: - You are encouraged to move any fingers which are not in the bandage.  - Light use of the fingers is allowed to assist the other hand with daily hygiene and eating, but strong gripping or lifting is often uncomfortable and should be avoided.  - You might miss a variable period of time from work and hopefully this issue has been discussed prior to surgery. You may not do any heavy work with your affected hand for about 2 weeks.    [facility] [address] [place],  [place]  [address] [phone]  Post Anesthesia Home Care Instructions  Activity: Get plenty of rest for the remainder of the day. A responsible  individual must stay with you for 24 hours following the procedure.  For the next 24 hours, DO NOT: -Drive a car -Operate machinery -Drink alcoholic beverages -Take any medication unless instructed by your physician -Make any legal decisions or sign important papers.  Meals: Start with liquid foods such as gelatin or soup. Progress to regular foods as tolerated. Avoid greasy, spicy, heavy foods. If nausea and/or vomiting occur, drink only clear liquids until the nausea and/or vomiting subsides. Call your physician if vomiting continues.  Special Instructions/Symptoms: Your throat may feel dry or sore from the anesthesia or the breathing tube placed in your throat during surgery. If this causes discomfort, gargle with warm salt water. The discomfort should disappear within 24 hours.  If you had a scopolamine patch placed behind your ear for the management of post- operative nausea and/or vomiting:  1. The medication in the patch is effective for 72 hours, after which it should be removed.  Wrap patch in a tissue and discard in the trash. Wash hands thoroughly with soap and water. 2. You may remove the patch earlier than 72 hours if you experience unpleasant side effects which may include dry mouth, dizziness or visual disturbances. 3. Avoid touching the patch. Wash your hands with soap and water after contact with the patch.         

## 2022-02-14 NOTE — Brief Op Note (Signed)
02/14/2022 ? ?12:52 PM ? ?PATIENT:  Sonia Diaz  51 y.o. female ? ?PRE-OPERATIVE DIAGNOSIS:  RIGHT CARPAL TUNNEL SYNDROME ? ?POST-OPERATIVE DIAGNOSIS:  RIGHT CARPAL TUNNEL SYNDROME ? ?PROCEDURE:  Procedure(s): ?RIGHT CARPAL TUNNEL RELEASE (Right) ? ?SURGEON:  Surgeon(s) and Role: ?   * Sherilyn Cooter, MD - Primary ? ?PHYSICIAN ASSISTANT:  ? ?ASSISTANTS: none  ? ?ANESTHESIA:   local and MAC ? ?EBL:  1 mL  ? ?BLOOD ADMINISTERED:none ? ?DRAINS: none  ? ?LOCAL MEDICATIONS USED:  MARCAINE    ? ?SPECIMEN:  No Specimen ? ?DISPOSITION OF SPECIMEN:  N/A ? ?COUNTS:  YES ? ?TOURNIQUET:   ?Total Tourniquet Time Documented: ?Forearm (Right) - 16 minutes ?Total: Forearm (Right) - 16 minutes ? ? ?DICTATION: .Dragon Dictation ? ?PLAN OF CARE: Discharge to home after PACU ? ?PATIENT DISPOSITION:  PACU - hemodynamically stable. ?  ?Delay start of Pharmacological VTE agent (>24hrs) due to surgical blood loss or risk of bleeding: not applicable ? ?

## 2022-02-14 NOTE — Anesthesia Preprocedure Evaluation (Addendum)
Anesthesia Evaluation  ?Patient identified by MRN, date of birth, ID band ?Patient awake ? ? ? ?Reviewed: ?Allergy & Precautions, NPO status , Patient's Chart, lab work & pertinent test results ? ?Airway ?Mallampati: II ? ?TM Distance: >3 FB ?Neck ROM: Full ? ? ? Dental ? ?(+) Teeth Intact, Dental Advisory Given ?  ?Pulmonary ?neg pulmonary ROS,  ?  ?breath sounds clear to auscultation ? ? ? ? ? ? Cardiovascular ?hypertension,  ?Rhythm:Regular Rate:Normal ? ? ?  ?Neuro/Psych ?negative neurological ROS ? negative psych ROS  ? GI/Hepatic ?negative GI ROS, Neg liver ROS,   ?Endo/Other  ?negative endocrine ROS ? Renal/GU ?negative Renal ROS  ? ?  ?Musculoskeletal ? ?(+) Arthritis ,  ? Abdominal ?Normal abdominal exam  (+)   ?Peds ? Hematology ?negative hematology ROS ?(+)   ?Anesthesia Other Findings ? ? Reproductive/Obstetrics ? ?  ? ? ? ? ? ? ? ? ? ? ? ? ? ?  ?  ? ? ? ? ? ? ?Anesthesia Physical ?Anesthesia Plan ? ?ASA: 2 ? ?Anesthesia Plan: MAC  ? ?Post-op Pain Management:   ? ?Induction:  ? ?PONV Risk Score and Plan: 3 and Ondansetron, Propofol infusion and Midazolam ? ?Airway Management Planned: Natural Airway and Simple Face Mask ? ?Additional Equipment: None ? ?Intra-op Plan:  ? ?Post-operative Plan:  ? ?Informed Consent: I have reviewed the patients History and Physical, chart, labs and discussed the procedure including the risks, benefits and alternatives for the proposed anesthesia with the patient or authorized representative who has indicated his/her understanding and acceptance.  ? ? ? ? ? ?Plan Discussed with: CRNA ? ?Anesthesia Plan Comments:   ? ? ? ? ?Anesthesia Quick Evaluation ? ?

## 2022-02-14 NOTE — Transfer of Care (Signed)
Immediate Anesthesia Transfer of Care Note ? ?Patient: Sonia Diaz ? ?Procedure(s) Performed: RIGHT CARPAL TUNNEL RELEASE (Right: Wrist) ? ?Patient Location: PACU ? ?Anesthesia Type:MAC ? ?Level of Consciousness: awake ? ?Airway & Oxygen Therapy: Patient Spontanous Breathing ? ?Post-op Assessment: Report given to RN and Post -op Vital signs reviewed and stable ? ?Post vital signs: Reviewed and stable ? ?Last Vitals:  ?Vitals Value Taken Time  ?BP 139/82 02/14/22 1300  ?Temp 36.4 ?C 02/14/22 1300  ?Pulse 63 02/14/22 1300  ?Resp 18 02/14/22 1300  ?SpO2 96 % 02/14/22 1300  ? ? ?Last Pain:  ?Vitals:  ? 02/14/22 1315  ?TempSrc:   ?PainSc: 0-No pain  ?   ? ?Patients Stated Pain Goal: 3 (02/14/22 1315) ? ?Complications: No notable events documented. ?

## 2022-02-14 NOTE — Op Note (Signed)
? ?  Date of Surgery: 02/14/2022 ? ?INDICATIONS: Patient is a 51 y.o.-year-old female with right carpal tunnel syndrome that was confirmed on electrodiagnostic studies and has failed conservative management.  Risks, benefits, and alternatives to surgery were again discussed with the patient in the preoperative area. The patient wishes to proceed with surgery.  Informed consent was signed after our discussion.  ? ?PREOPERATIVE DIAGNOSIS:  ?Right carpal tunnel syndrome ? ?POSTOPERATIVE DIAGNOSIS: Same. ? ?PROCEDURE:  ?Right carpal tunnel release ? ? ?SURGEON: Audria Nine, M.D. ? ?ASSIST:  ? ?ANESTHESIA:  Local, MAC ? ?IV FLUIDS AND URINE: See anesthesia. ? ?ESTIMATED BLOOD LOSS: <5 mL. ? ?IMPLANTS: * No implants in log *  ? ?DRAINS: None ? ?COMPLICATIONS: None ? ?DESCRIPTION OF PROCEDURE: The patient was met in the preoperative holding area where the surgical site was marked and the consent form was verified.  The patient was then taken to the operating room and transferred to the operating table.  All bony prominences were well padded.  A tourniquet was applied to the right forearm.  Monitored sedation was induced.   A formal time-out was performed to confirm that this was the correct patient, surgery, side, and site. A local block was performed using 10 cc of 0.25 marcaine. The operative extremity was prepped and draped in the usual and sterile fashion.   ? ?Following a second timeout, the limb was exsanguinated and the tourniquet inflated to 250 mmHg.  A longitudinal incision was made in line with the radial border of the ring finger from distal to the wrist flexion crease to the intersection of Kaplan's cardinal line.  The skin and subcutaneous tissue was sharply divided.  The longitudinally running palmar fascia was incised.  The thenar musculature was bluntly swept off of the transverse carpal ligament.  The ligament was divided from proximal to distal until the fat surrounding the palmar arch was encountered.  Retractors were then placed in the proximal aspect of the wound to visualize the distal antebrachial fascia.  The fascia was sharply divided under direct visualization.   The wound was then thoroughly irrigated with sterile saline.  The tourniquet was deflated.  Hemostasis was achieved with direct pressure and bipolar electrocautery.  The wound was then closed with 4-0 nylon sutures in a horizontal mattress fashion. The wound was then dressed with xeroform, folded kerlix, and an ace wrap. ? ?The patient was then reversed from anesthesia and transferred to the postoperative bed.  All counts were correct x 2 at the end of the procedure.  The patient was taken to the recovery unit in stable condition.    ? ?POSTOPERATIVE PLAN: She will be discharged to home with appropriate pain medication and discharge instructions.  I will see her back in the office in 10-14 days for her first postop visit.  ? ?Audria Nine, MD ?12:54 PM  ?

## 2022-02-15 ENCOUNTER — Encounter (HOSPITAL_BASED_OUTPATIENT_CLINIC_OR_DEPARTMENT_OTHER): Payer: Self-pay | Admitting: Orthopedic Surgery

## 2022-02-24 ENCOUNTER — Ambulatory Visit (INDEPENDENT_AMBULATORY_CARE_PROVIDER_SITE_OTHER): Payer: BLUE CROSS/BLUE SHIELD | Admitting: Orthopedic Surgery

## 2022-02-24 ENCOUNTER — Encounter: Payer: Self-pay | Admitting: Orthopedic Surgery

## 2022-02-24 DIAGNOSIS — G5601 Carpal tunnel syndrome, right upper limb: Secondary | ICD-10-CM

## 2022-02-24 NOTE — Progress Notes (Signed)
? ?  Post-Op Visit Note ?  ?Patient: Sonia Diaz           ?Date of Birth: Sep 18, 1971           ?MRN: [id number] ?Visit Date: 02/24/2022 ?PCP: Hayden Rasmussen, MD ? ? ?Assessment & Plan: ? ?Chief Complaint:  ?Chief Complaint  ?Patient presents with  ? Right Wrist - Routine Post Op  ? ?Visit Diagnoses:  ?1. Carpal tunnel syndrome, right upper limb   ? ? ?Plan: Patient is just under two weeks s/p right carpal tunnel release.  She is doing well postoperatively.  Her nocturnal symptoms have resolved.  Sutures removed.  She has intact thenar motor function. I can see her back again as needed.  ? ?Follow-Up Instructions: No follow-ups on file.  ? ?Orders:  ?No orders of the defined types were placed in this encounter. ? ?No orders of the defined types were placed in this encounter. ? ? ?Imaging: ?No results found. ? ?PMFS History: ?Patient Active Problem List  ? Diagnosis Date Noted  ? Carpal tunnel syndrome, right upper limb 02/01/2022  ? Arthritis of carpometacarpal [facility]) joint of left thumb 12/09/2021  ? Numbness and tingling in right hand 12/09/2021  ? Essential hypertension 10/21/2020  ? Prolonged Q-T interval on ECG 10/21/2020  ? Vaginal discharge 07/11/2013  ? ?Past Medical History:  ?Diagnosis Date  ? Hypertension   ?  ?Family History  ?Adopted: Yes  ?  ?Past Surgical History:  ?Procedure Laterality Date  ? CARPAL TUNNEL RELEASE Right 02/14/2022  ? Procedure: RIGHT CARPAL TUNNEL RELEASE;  Surgeon: Sherilyn Cooter, MD;  Location: [facility];  Service: Orthopedics;  Laterality: Right;  ? CESAREAN SECTION    ? x 2   ? HERNIA REPAIR    ? ?Social History  ? ?Occupational History  ? Not on file  ?Tobacco Use  ? Smoking status: Never  ? Smokeless tobacco: Never  ?Vaping Use  ? Vaping Use: Never used  ?Substance and Sexual Activity  ? Alcohol use: Yes  ?  Comment: 1 q wk  ? Drug use: No  ? Sexual activity: Not on file  ? ? ? ?

## 2022-03-28 ENCOUNTER — Ambulatory Visit (INDEPENDENT_AMBULATORY_CARE_PROVIDER_SITE_OTHER): Payer: BLUE CROSS/BLUE SHIELD | Admitting: Orthopedic Surgery

## 2022-03-28 DIAGNOSIS — G5601 Carpal tunnel syndrome, right upper limb: Secondary | ICD-10-CM

## 2022-03-28 NOTE — Progress Notes (Signed)
   Post-Op Visit Note   Patient: Sonia Diaz           Date of Birth: 1971/06/10           MRN: [id number] Visit Date: 03/28/2022 PCP: Hayden Rasmussen, MD   Assessment & Plan:  Chief Complaint:  Chief Complaint  Patient presents with   Right Wrist - Wound Check, Routine Post Op    Was bit by dog approx 03/10/22 on right index finger while trying to clean her dogs ears that were infected. She states that she had stitches removes 03/24/22 and then 03/25/22 she noticed the knot on her incision, and states that it has had pimple like drainage from it   Visit Diagnoses:  1. Carpal tunnel syndrome, right upper limb     Plan: Patient is 6 weeks out from her carpal tunnel release.  She was bitten on her right index figner approximatley 2.5 weeks ago.  She was seen in the urgent care where the incision was clean and loosely closed.  It has since healed and the sutures removed.  On Friday she noticed a painful red bump at the proximal aspect of her carpal tunnel incision.  It has previously drained scant purulent fluid.  It is not draining today.  It is approximatey 0.5 x 0.5 cm without surrounding erythema.  It seems superficial.  She has no pain along her carpal tunnel incision or her wrist.  She should continue to keep the area clean with soaks in warm, soapy water.  I can see her back in a week if the small lesion is still there.   Follow-Up Instructions: No follow-ups on file.   Orders:  No orders of the defined types were placed in this encounter.  No orders of the defined types were placed in this encounter.   Imaging: No results found.  PMFS History: Patient Active Problem List   Diagnosis Date Noted   Carpal tunnel syndrome, right upper limb 02/01/2022   Arthritis of carpometacarpal [facility]) joint of left thumb 12/09/2021   Numbness and tingling in right hand 12/09/2021   Essential hypertension 10/21/2020   Prolonged Q-T interval on ECG 10/21/2020   Vaginal discharge 07/11/2013    Past Medical History:  Diagnosis Date   Hypertension     Family History  Adopted: Yes    Past Surgical History:  Procedure Laterality Date   CARPAL TUNNEL RELEASE Right 02/14/2022   Procedure: RIGHT CARPAL TUNNEL RELEASE;  Surgeon: Sherilyn Cooter, MD;  Location: [place];  Service: Orthopedics;  Laterality: Right;   CESAREAN SECTION     x 2    HERNIA REPAIR     Social History   Occupational History   Not on file  Tobacco Use   Smoking status: Never   Smokeless tobacco: Never  Vaping Use   Vaping Use: Never used  Substance and Sexual Activity   Alcohol use: Yes    Comment: 1 q wk   Drug use: No   Sexual activity: Not on file

## 2022-09-28 ENCOUNTER — Ambulatory Visit
Admission: RE | Admit: 2022-09-28 | Discharge: 2022-09-28 | Disposition: A | Discharge status: Home / Self Care | Payer: BLUE CROSS/BLUE SHIELD | Source: Ambulatory Visit | Attending: Family Medicine | Admitting: Family Medicine

## 2022-09-28 VITALS — BP 148/89 | HR 97 | Temp 98.5°F | Resp 18 | Ht 64.0 in | Wt 170.0 lb

## 2022-09-28 DIAGNOSIS — R9431 Abnormal electrocardiogram [ECG] [EKG]: Secondary | ICD-10-CM | POA: Diagnosis not present

## 2022-09-28 DIAGNOSIS — J111 Influenza due to unidentified influenza virus with other respiratory manifestations: Secondary | ICD-10-CM

## 2022-09-28 LAB — POC SARS CORONAVIRUS 2 AG -  ED: SARS Coronavirus 2 Ag: NEGATIVE

## 2022-09-28 LAB — POCT INFLUENZA A/B
Influenza A, POC: POSITIVE — AB
Influenza B, POC: NEGATIVE

## 2022-09-28 LAB — POCT RAPID STREP A (OFFICE): Rapid Strep A Screen: NEGATIVE

## 2022-09-28 MED ORDER — DEXAMETHASONE SODIUM PHOSPHATE 10 MG/ML IJ SOLN
10.0000 mg | Freq: Once | INTRAMUSCULAR | Status: AC
  Administered 2022-09-28: 10 mg via INTRAMUSCULAR

## 2022-09-28 MED ORDER — OSELTAMIVIR PHOSPHATE 75 MG PO CAPS: 75.0000 mg | ORAL_CAPSULE | Freq: Two times a day (BID) | ORAL | 0 refills | Status: DC

## 2022-09-28 NOTE — ED Triage Notes (Signed)
Patient c/o cough, fever and sore throat x 3 days.  Patient's temp of 100.9 on Tylenol and Robitussin CF.

## 2022-09-28 NOTE — ED Provider Notes (Signed)
[facility]    CSN: [id number] Arrival date & time: 09/28/22  1406      History   Chief Complaint Chief Complaint  Patient presents with   Sore Throat    Flu or strep like symptoms fever cough sore throat nausea on day 3 and it's getting worse been taking Tylenol and robitussun every 4 hours not helping - Entered by patient    HPI Sonia Diaz is a 51 y.o. female.   HPI  Patient states she has been sick for 3 days, feels like she is getting worse every day.  She has headache, body aches, sore throat, and nausea.  She states that she has been trying to take Robitussin although today she developed vomiting.  She has vomited repeatedly.  She vomited up the Robitussin  Past Medical History:  Diagnosis Date   Hypertension     Patient Active Problem List   Diagnosis Date Noted   Carpal tunnel syndrome, right upper limb 02/01/2022   Arthritis of carpometacarpal ([facility]) joint of left thumb 12/09/2021   Numbness and tingling in right hand 12/09/2021   Essential hypertension 10/21/2020   Prolonged Q-T interval on ECG 10/21/2020    Past Surgical History:  Procedure Laterality Date   CARPAL TUNNEL RELEASE Right 02/14/2022   Procedure: RIGHT CARPAL TUNNEL RELEASE;  Surgeon: Sherilyn Cooter, MD;  Location: [place];  Service: Orthopedics;  Laterality: Right;   CESAREAN SECTION     x 2    HERNIA REPAIR      OB History   No obstetric history on file.      Home Medications    Prior to Admission medications   Medication Sig Start Date End Date Taking? Authorizing Provider  oseltamivir (TAMIFLU) 75 MG capsule Take 1 capsule (75 mg total) by mouth every 12 (twelve) hours. 09/28/22  Yes Raylene Everts, MD  meloxicam (MOBIC) 7.5 MG tablet Take 1 tablet (7.5 mg total) by mouth daily. 12/09/21   Sherilyn Cooter, MD    Family History Family History  Adopted: Yes    Social History Social History   Tobacco Use   Smoking status: Never    Smokeless tobacco: Never  Vaping Use   Vaping Use: Never used  Substance Use Topics   Alcohol use: Yes    Comment: 1 q wk   Drug use: No     Allergies   Benadryl [diphenhydramine hcl], Diflucan [fluconazole], Sulfa antibiotics, and Lisinopril   Review of Systems Review of Systems See HPI  Physical Exam Triage Vital Signs ED Triage Vitals [09/28/22 1445]  Enc Vitals Group     BP (!) 148/89     Pulse Rate 97     Resp 18     Temp 98.5 F (36.9 C)     Temp Source Oral     SpO2 99 %     Weight 170 lb (77.1 kg)     Height '5\' 4"'$  (1.626 m)     Head Circumference      Peak Flow      Pain Score 0     Pain Loc      Pain Edu?      Excl. in [place]?    No data found.  Updated Vital Signs BP (!) 148/89 (BP Location: Right Arm)   Pulse 97   Temp 98.5 F (36.9 C) (Oral)   Resp 18   Ht '5\' 4"'$  (1.626 m)   Wt 77.1 kg   LMP 09/28/2022  SpO2 99%   BMI 29.18 kg/m       Physical Exam Constitutional:      General: She is not in acute distress.    Appearance: She is well-developed. She is ill-appearing.  HENT:     Head: Normocephalic and atraumatic.     Right Ear: Tympanic membrane normal.     Left Ear: Tympanic membrane normal.     Nose: Congestion present. No rhinorrhea.     Mouth/Throat:     Mouth: Mucous membranes are moist.     Pharynx: Posterior oropharyngeal erythema present.  Eyes:     Conjunctiva/sclera: Conjunctivae normal.     Pupils: Pupils are equal, round, and reactive to light.  Cardiovascular:     Rate and Rhythm: Normal rate and regular rhythm.     Heart sounds: Normal heart sounds.  Pulmonary:     Effort: Pulmonary effort is normal. No respiratory distress.     Breath sounds: Normal breath sounds.  Abdominal:     General: There is no distension.     Palpations: Abdomen is soft.  Musculoskeletal:        General: Normal range of motion.     Cervical back: Normal range of motion.  Skin:    General: Skin is warm and dry.  Neurological:     Mental  Status: She is alert.      UC Treatments / Results  Labs (all labs ordered are listed, but only abnormal results are displayed) Labs Reviewed  POCT INFLUENZA A/B - Abnormal; Notable for the following components:      Result Value   Influenza A, POC Positive (*)    All other components within normal limits  POC SARS CORONAVIRUS 2 AG -  ED  POCT RAPID STREP A (OFFICE)    EKG   Radiology No results found.  Procedures Procedures (including critical care time)  Medications Ordered in UC Medications  dexamethasone (DECADRON) injection 10 mg (10 mg Intramuscular Given 09/28/22 1550)    Initial Impression / Assessment and Plan / UC Course  I have reviewed the triage vital signs and the nursing notes.  Pertinent labs & imaging results that were available during my care of the patient were reviewed by me and considered in my medical decision making (see chart for details).     Patient is positive for influenza negative for strep.  Because of her prolonged QT interval she cannot take any of the usual antiemetics (Phenergan, Compazine, Zofran, Reglan) I am choosing to give her a dose of Decadron to see if this helps to soothe her stomach.  Home to push fluids p.o.  If unable to keep down fluids May need to go to the emergency room Final Clinical Impressions(s) / UC Diagnoses   Final diagnoses:  Influenza  Prolonged QT interval     Discharge Instructions      Try to increase your fluids If you continue vomiting and feel dehydrated you may need to go to the emergency room Take Tamiflu 2 times a day for 5 days Use over-the-counter cough and cold medicines    ED Prescriptions     Medication Sig Dispense Auth. Provider   oseltamivir (TAMIFLU) 75 MG capsule Take 1 capsule (75 mg total) by mouth every 12 (twelve) hours. 10 capsule Raylene Everts, MD      PDMP not reviewed this encounter.   Raylene Everts, MD 09/28/22 [phone]

## 2022-09-28 NOTE — Discharge Instructions (Signed)
Try to increase your fluids If you continue vomiting and feel dehydrated you may need to go to the emergency room Take Tamiflu 2 times a day for 5 days Use over-the-counter cough and cold medicines

## 2022-09-29 ENCOUNTER — Telehealth: Payer: Self-pay

## 2022-09-29 NOTE — Telephone Encounter (Signed)
LMTRC if any questions or concerns. 

## 2023-02-20 ENCOUNTER — Ambulatory Visit
Admission: RE | Admit: 2023-02-20 | Discharge: 2023-02-20 | Disposition: A | Discharge status: Home / Self Care | Payer: BLUE CROSS/BLUE SHIELD | Source: Ambulatory Visit | Attending: Emergency Medicine | Admitting: Emergency Medicine

## 2023-02-20 VITALS — BP 131/88 | HR 86 | Temp 98.5°F | Resp 16 | Ht 64.0 in

## 2023-02-20 DIAGNOSIS — L237 Allergic contact dermatitis due to plants, except food: Secondary | ICD-10-CM

## 2023-02-20 MED ORDER — PREDNISONE 10 MG PO TABS: ORAL_TABLET | ORAL | 0 refills | Status: AC

## 2023-02-20 MED ORDER — MUPIROCIN 2 % EX OINT: 1.0000 | TOPICAL_OINTMENT | Freq: Two times a day (BID) | CUTANEOUS | 0 refills | Status: AC

## 2023-02-20 MED ORDER — TRIAMCINOLONE ACETONIDE 0.1 % EX CREA: 1.0000 | TOPICAL_CREAM | Freq: Two times a day (BID) | CUTANEOUS | 0 refills | Status: DC

## 2023-02-20 MED ORDER — TRIAMCINOLONE ACETONIDE 0.1 % EX CREA: 1.0000 | TOPICAL_CREAM | Freq: Two times a day (BID) | CUTANEOUS | 0 refills | Status: AC

## 2023-02-20 MED ORDER — CEPHALEXIN 500 MG PO CAPS
1000.0000 mg | ORAL_CAPSULE | Freq: Two times a day (BID) | ORAL | 0 refills | Status: AC
Start: 2023-02-20 — End: 2023-02-25

## 2023-02-20 NOTE — ED Triage Notes (Signed)
Raised red rash started on Friday morning  No changes in meds or soaps or detergents Hive noted area by right axilla & left axilla Drainage to right side  Rubbing alcohol to areas No hydrocortisone

## 2023-02-20 NOTE — ED Provider Notes (Signed)
HPI  SUBJECTIVE:  Sonia Diaz is a 52 y.o. female who presents with a mildly painful, erythematous, raised pruritic rash under her  bilateral axilla starting 3 days ago.  She states the right side is "weeping constantly".  She states that looked like hives at first.  She states that the rash is spreading across to her back on the right side.  No preceding paresthesias, fevers, bodies, flulike symptoms.  She reports occasional temporary nausea.  No new lotions, soaps, detergents, deodorants, known exposure to poison ivy or poison oak, but she has outdoor pets that come inside.  She has gotten poison ivy from her pets before.  She has tried hydrocortisone cream and calamine lotion with improvement in her symptoms.  No aggravating factors.  She has a past medical history of prolonged QT syndrome, varicella, hypertension and MRSA.  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  PCP: [facility].    Past Medical History:  Diagnosis Date   Hypertension     Past Surgical History:  Procedure Laterality Date   CARPAL TUNNEL RELEASE Right 02/14/2022   Procedure: RIGHT CARPAL TUNNEL RELEASE;  Surgeon: Marlyne Beards, MD;  Location: [facility];  Service: Orthopedics;  Laterality: Right;   CESAREAN SECTION     x 2    HERNIA REPAIR      Family History  Adopted: Yes  Problem Relation Age of Onset   CAD Father     Social History   Tobacco Use   Smoking status: Never   Smokeless tobacco: Never  Vaping Use   Vaping Use: Never used  Substance Use Topics   Alcohol use: Yes    Comment: 1 q wk   Drug use: No    No current facility-administered medications for this encounter.  Current Outpatient Medications:    cephALEXin (KEFLEX) 500 MG capsule, Take 2 capsules (1,000 mg total) by mouth 2 (two) times daily for 5 days., Disp: 20 capsule, Rfl: 0   mupirocin ointment (BACTROBAN) 2 %, Apply 1 Application topically 2 (two) times daily., Disp: 22 g, Rfl: 0    predniSONE (DELTASONE) 10 MG tablet, 6 tabs on day 1-2, 5 tabs on day 3-4, 4 tabs on day 5-6, 3 tabs on day 7-8, 2 tabs day 9-10, 1 tab day 11-12, Disp: 42 tablet, Rfl: 0   triamcinolone cream (KENALOG) 0.1 %, Apply 1 Application topically 2 (two) times daily. Apply for 2 weeks. May use on face, Disp: 30 g, Rfl: 0  Allergies  Allergen Reactions   Sulfa Antibiotics Other (See Comments)    Childhood allergy   Benadryl [Diphenhydramine Hcl] Anxiety    Agitated    Diflucan [Fluconazole] Rash    Possible skin rash   Lisinopril Cough     ROS  As noted in HPI.   Physical Exam  BP 131/88 (BP Location: Left Arm)   Pulse 86   Temp 98.5 F (36.9 C) (Oral)   Resp 16   Ht 5\' 4"  (1.626 m)   SpO2 99%   BMI 29.18 kg/m   Constitutional: Well developed, well nourished, no acute distress Eyes:  EOMI, conjunctiva normal bilaterally HENT: Normocephalic, atraumatic,mucus membranes moist Respiratory: Normal inspiratory effort Cardiovascular: Normal rate GI: nondistended  Skin:   Right axilla: Beefy, tender, wet red plaque with nontender papular grouped rash extending to her back in a dermatomal distribution skin:    Nontender erythematous papules left axilla   Musculoskeletal: no deformities Neurologic: Alert & oriented  x 3, no focal neuro deficits Psychiatric: Speech and behavior appropriate   ED Course   Medications - No data to display  No orders of the defined types were placed in this encounter.   No results found for this or any previous visit (from the past 24 hour(s)). No results found.  ED Clinical Impression  1. Allergic contact dermatitis due to plants, except food      ED Assessment/Plan     I considered shingles as she is under an increased amount of stress, however, it is bilateral and she has outdoor animals and states that she is highly allergic to poison ivy/poison oak.    Sending home with prednisone for 12 days, triamcinolone cream.  She is to  start Claritin.  Atarax prolongs QT.  Will also send home with Bactroban and Keflex as I am concerned that she could be getting secondarily infected, particularly on the right side.  Follow-up with PCP as needed.  ER return precautions given.  Discussed MDM, treatment plan, and plan for follow-up with patient. Discussed sn/sx that should prompt return to the ED. patient agrees with plan.   Meds ordered this encounter  Medications   triamcinolone cream (KENALOG) 0.1 %    Sig: Apply 1 Application topically 2 (two) times daily. Apply for 2 weeks. May use on face    Dispense:  30 g    Refill:  0   predniSONE (DELTASONE) 10 MG tablet    Sig: 6 tabs on day 1-2, 5 tabs on day 3-4, 4 tabs on day 5-6, 3 tabs on day 7-8, 2 tabs day 9-10, 1 tab day 11-12    Dispense:  42 tablet    Refill:  0   mupirocin ointment (BACTROBAN) 2 %    Sig: Apply 1 Application topically 2 (two) times daily.    Dispense:  22 g    Refill:  0   cephALEXin (KEFLEX) 500 MG capsule    Sig: Take 2 capsules (1,000 mg total) by mouth 2 (two) times daily for 5 days.    Dispense:  20 capsule    Refill:  0      *This clinic note was created using Scientist, clinical (histocompatibility and immunogenetics). Therefore, there may be occasional mistakes despite careful proofreading.  ?    Domenick Gong, MD 02/20/23 1528

## 2023-02-20 NOTE — Discharge Instructions (Signed)
Finish the prednisone and Keflex, even if you feel better.  You may take Claritin to help with the itching.  Bactroban mixed with triamcinolone twice a day will help with any possible infection and also the itching.  Use TecNu before going out in areas with known poison ivy/oak.  This will help prevent you from getting poison ivy/oak.  If you get a rash, you can use Zanfel or TecNu extreme to deactivate the oil, which will stop the rash from spreading and help with the itching.  Apply antibacterial ointment on scabbed areas to help prevent infection.  If you were given steroids, make sure you finish all of them.   Dissolve 1 packet (or tablet) of Domeboro (aluminum acetate) in 1 pint of luke-warm water. Soak the affected areas with luke-warm Domeboro solution for 5-10 minutes twice daily. You may use apply gauze soaked in the domeboro.  Gently pat dry, Then apply the steriod / antibiotic cream. You may also take oatmeal baths with Aveeno oatmeal (1 cup in half full bathtub) or cornstarch/baking soda (1 cup each in half full bathtub). To prevent the oatmeal from caking in pipes, place it in a tied sock before dropping it into the bathtub.  Go to www.goodrx.com to look up your medications. This will give you a list of where you can find your prescriptions at the most affordable prices. Or ask the pharmacist what the cash price is, or if they have any other discount programs available to help make your medication more affordable. This can be less expensive than what you would pay with insurance.

## 2023-08-22 ENCOUNTER — Emergency Department (HOSPITAL_BASED_OUTPATIENT_CLINIC_OR_DEPARTMENT_OTHER): Payer: BLUE CROSS/BLUE SHIELD

## 2023-08-22 ENCOUNTER — Other Ambulatory Visit: Payer: Self-pay

## 2023-08-22 ENCOUNTER — Emergency Department (HOSPITAL_BASED_OUTPATIENT_CLINIC_OR_DEPARTMENT_OTHER)
Admission: EM | Admit: 2023-08-22 | Discharge: 2023-08-22 | Disposition: A | Discharge status: Home / Self Care | Payer: BLUE CROSS/BLUE SHIELD | Attending: Emergency Medicine | Admitting: Emergency Medicine

## 2023-08-22 ENCOUNTER — Encounter (HOSPITAL_BASED_OUTPATIENT_CLINIC_OR_DEPARTMENT_OTHER): Payer: Self-pay

## 2023-08-22 DIAGNOSIS — Z79899 Other long term (current) drug therapy: Secondary | ICD-10-CM | POA: Diagnosis not present

## 2023-08-22 DIAGNOSIS — R202 Paresthesia of skin: Secondary | ICD-10-CM | POA: Diagnosis not present

## 2023-08-22 DIAGNOSIS — R55 Syncope and collapse: Secondary | ICD-10-CM | POA: Insufficient documentation

## 2023-08-22 DIAGNOSIS — I10 Essential (primary) hypertension: Secondary | ICD-10-CM | POA: Diagnosis not present

## 2023-08-22 DIAGNOSIS — R209 Unspecified disturbances of skin sensation: Secondary | ICD-10-CM | POA: Diagnosis present

## 2023-08-22 LAB — CBC WITH DIFFERENTIAL/PLATELET
Abs Immature Granulocytes: 0.04 10*3/uL (ref 0.00–0.07)
Basophils Absolute: 0.1 10*3/uL (ref 0.0–0.1)
Basophils Relative: 1 %
Eosinophils Absolute: 0.1 10*3/uL (ref 0.0–0.5)
Eosinophils Relative: 1 %
HCT: 42.5 % (ref 36.0–46.0)
Hemoglobin: 14.6 g/dL (ref 12.0–15.0)
Immature Granulocytes: 0 %
Lymphocytes Relative: 26 %
Lymphs Abs: 2.6 10*3/uL (ref 0.7–4.0)
MCH: 28 pg (ref 26.0–34.0)
MCHC: 34.4 g/dL (ref 30.0–36.0)
MCV: 81.4 fL (ref 80.0–100.0)
Monocytes Absolute: 0.6 10*3/uL (ref 0.1–1.0)
Monocytes Relative: 6 %
Neutro Abs: 6.8 10*3/uL (ref 1.7–7.7)
Neutrophils Relative %: 66 %
Platelets: 217 10*3/uL (ref 150–400)
RBC: 5.22 MIL/uL — ABNORMAL HIGH (ref 3.87–5.11)
RDW: 13.6 % (ref 11.5–15.5)
WBC: 10.2 10*3/uL (ref 4.0–10.5)
nRBC: 0 % (ref 0.0–0.2)

## 2023-08-22 LAB — URINALYSIS, ROUTINE W REFLEX MICROSCOPIC
Bilirubin Urine: NEGATIVE
Glucose, UA: NEGATIVE mg/dL
Hgb urine dipstick: NEGATIVE
Ketones, ur: NEGATIVE mg/dL
Leukocytes,Ua: NEGATIVE
Nitrite: NEGATIVE
Protein, ur: NEGATIVE mg/dL
Specific Gravity, Urine: 1.01 (ref 1.005–1.030)
pH: 5.5 (ref 5.0–8.0)

## 2023-08-22 LAB — COMPREHENSIVE METABOLIC PANEL
ALT: 20 U/L (ref 0–44)
AST: 19 U/L (ref 15–41)
Albumin: 4.7 g/dL (ref 3.5–5.0)
Alkaline Phosphatase: 57 U/L (ref 38–126)
Anion gap: 10 (ref 5–15)
BUN: 11 mg/dL (ref 6–20)
CO2: 24 mmol/L (ref 22–32)
Calcium: 9.8 mg/dL (ref 8.9–10.3)
Chloride: 101 mmol/L (ref 98–111)
Creatinine, Ser: 0.78 mg/dL (ref 0.44–1.00)
GFR, Estimated: >60 mL/min (ref >60)
Glucose, Bld: 97 mg/dL (ref 70–99)
Potassium: 3.8 mmol/L (ref 3.5–5.1)
Sodium: 135 mmol/L (ref 135–145)
Total Bilirubin: 0.6 mg/dL (ref <1.2)
Total Protein: 8.2 g/dL — ABNORMAL HIGH (ref 6.5–8.1)

## 2023-08-22 LAB — TROPONIN I (HIGH SENSITIVITY)
Troponin I (High Sensitivity): 2 ng/L (ref <18)
Troponin I (High Sensitivity): <2 ng/L (ref <18)

## 2023-08-22 LAB — HCG, SERUM, QUALITATIVE: Preg, Serum: NEGATIVE

## 2023-08-22 LAB — MAGNESIUM: Magnesium: 1.7 mg/dL (ref 1.7–2.4)

## 2023-08-22 MED ORDER — MECLIZINE HCL 25 MG PO TABS
25.0000 mg | ORAL_TABLET | Freq: Once | ORAL | Status: AC
  Administered 2023-08-22: 25 mg via ORAL
  Filled 2023-08-22: qty 1

## 2023-08-22 MED ORDER — MECLIZINE HCL 25 MG PO TABS: 25.0000 mg | ORAL_TABLET | Freq: Three times a day (TID) | ORAL | 0 refills | Status: AC | PRN

## 2023-08-22 NOTE — ED Notes (Signed)
Pt has hx of hypertension, not taking meds as they cannot find one that works for her.

## 2023-08-22 NOTE — ED Provider Notes (Signed)
[facility] Provider Note   CSN: [id number] Arrival date & time: 08/22/23  1217     History  Chief Complaint  Patient presents with   Numbness    Sonia Diaz is a 52 y.o. female with a history of hypertension who presents the ED today after a presyncopal event.  Patient reports she got up from her desk at work around 10 AM this morning and was walking to the bathroom when she felt lightheaded with numbness to her lips, hands, and legs.  Patient states that she felt like she was going to pass out but never lost consciousness.  These symptoms lasted about 10-15 minutes and then resolved on their own.  She never felt anything like this before. No recent head injury or blood thinner use.   Patient states that she feels some numbness to her finger tips bilaterally at the time of evaluation but denies any other complaints or concerns.    Home Medications Prior to Admission medications   Medication Sig Start Date End Date Taking? Authorizing Provider  meclizine (ANTIVERT) 25 MG tablet Take 1 tablet (25 mg total) by mouth 3 (three) times daily as needed for dizziness. 08/22/23 09/21/23 Yes Maxwell Marion, PA-C  mupirocin ointment (BACTROBAN) 2 % Apply 1 Application topically 2 (two) times daily. 02/20/23   Domenick Gong, MD  predniSONE (DELTASONE) 10 MG tablet 6 tabs on day 1-2, 5 tabs on day 3-4, 4 tabs on day 5-6, 3 tabs on day 7-8, 2 tabs day 9-10, 1 tab day 11-12 02/20/23   Domenick Gong, MD  triamcinolone cream (KENALOG) 0.1 % Apply 1 Application topically 2 (two) times daily. Apply for 2 weeks. May use on face 02/20/23   Domenick Gong, MD      Allergies    Sulfa antibiotics, Benadryl [diphenhydramine hcl], Diflucan [fluconazole], and Lisinopril    Review of Systems   Review of Systems  Neurological:  Positive for light-headedness and numbness.  All other systems reviewed and are negative.   Physical Exam Updated Vital Signs BP  (!) 176/97   Pulse 96   Temp 98.6 F (37 C)   Resp 20   Ht 5\' 4"  (1.626 m)   Wt 84.4 kg   SpO2 99%   BMI 31.93 kg/m  Physical Exam Vitals and nursing note reviewed.  Constitutional:      General: She is not in acute distress.    Appearance: Normal appearance.  HENT:     Head: Normocephalic and atraumatic.     Mouth/Throat:     Mouth: Mucous membranes are moist.  Eyes:     Conjunctiva/sclera: Conjunctivae normal.     Pupils: Pupils are equal, round, and reactive to light.  Cardiovascular:     Rate and Rhythm: Normal rate and regular rhythm.     Pulses: Normal pulses.     Heart sounds: Normal heart sounds.  Pulmonary:     Effort: Pulmonary effort is normal.     Breath sounds: Normal breath sounds.  Abdominal:     Palpations: Abdomen is soft.     Tenderness: There is no abdominal tenderness.  Skin:    General: Skin is warm and dry.     Findings: No rash.  Neurological:     General: No focal deficit present.     Mental Status: She is alert.     Sensory: No sensory deficit.     Motor: No weakness.  Psychiatric:  Mood and Affect: Mood normal.        Behavior: Behavior normal.     Orthostatic Lying BP- Lying: 153/92 Abnormal  Pulse- Lying: 92 Orthostatic Sitting BP- Sitting: 146/96 Abnormal  Pulse- Sitting: 112 Orthostatic Standing at 0 minutes BP- Standing at 0 minutes: 157/109 Abnormal  Pulse- Standing at 0 minutes: 124 Orthostatic Standing at 3 minutes BP- Standing at 3 minutes: 162/106 Abnormal  Pulse- Standing at 3 minutes: 127   ED Results / Procedures / Treatments   Labs (all labs ordered are listed, but only abnormal results are displayed) Labs Reviewed  CBC WITH DIFFERENTIAL/PLATELET - Abnormal; Notable for the following components:      Result Value   RBC 5.22 (*)    All other components within normal limits  COMPREHENSIVE METABOLIC PANEL - Abnormal; Notable for the following components:   Total Protein 8.2 (*)    All other components  within normal limits  URINALYSIS, ROUTINE W REFLEX MICROSCOPIC  MAGNESIUM  HCG, SERUM, QUALITATIVE  TROPONIN I (HIGH SENSITIVITY)  TROPONIN I (HIGH SENSITIVITY)    EKG EKG Interpretation Date/Time:  Tuesday August 22 2023 12:32:29 EST Ventricular Rate:  102 PR Interval:  157 QRS Duration:  94 QT Interval:  380 QTC Calculation: 495 R Axis:   51  Text Interpretation: Sinus tachycardia Multiple ventricular premature complexes Aberrant conduction of SV complex(es) LAE, consider biatrial enlargement Low voltage, precordial leads Borderline T wave abnormalities Borderline prolonged QT interval when compared to prior, similar appearance with slighly faster rate. no STEMI Confirmed by Theda Belfast ([id number]) on 08/22/2023 3:09:38 PM  Radiology CT Head Wo Contrast  Result Date: 08/22/2023 CLINICAL DATA:  Mental status change, unknown cause. EXAM: CT HEAD WITHOUT CONTRAST TECHNIQUE: Contiguous axial images were obtained from the base of the skull through the vertex without intravenous contrast. RADIATION DOSE REDUCTION: This exam was performed according to the departmental dose-optimization program which includes automated exposure control, adjustment of the mA and/or kV according to patient size and/or use of iterative reconstruction technique. COMPARISON:  None Available. FINDINGS: Brain: No acute intracranial hemorrhage. Gray-white differentiation is preserved. No hydrocephalus or extra-axial collection. No mass effect or midline shift. Vascular: No hyperdense vessel or unexpected calcification. Skull: No calvarial fracture or suspicious bone lesion. Skull base is unremarkable. Sinuses/Orbits: No acute finding. Other: None. IMPRESSION: No acute intracranial abnormality. Electronically Signed   By: Orvan Falconer M.D.   On: 08/22/2023 15:29    Procedures Procedures: not indicated.   Medications Ordered in ED Medications  meclizine (ANTIVERT) tablet 25 mg (25 mg Oral Given 08/22/23 1443)     ED Course/ Medical Decision Making/ A&P                                 Medical Decision Making Amount and/or Complexity of Data Reviewed Labs: ordered. Radiology: ordered.   This patient presents to the ED for concern of presyncope, this involves an extensive number of treatment options, and is a complaint that carries with it a high risk of complications and morbidity.   Differential diagnosis includes: vasovagal syncope, orthostatic hypotension, dehydration, electrolyte derangement, cardiac arrhythmia,   Comorbidities  See HPI above   Additional History  Additional history obtained from PCP notes.   Cardiac Monitoring / EKG  The patient was maintained on a cardiac monitor.  I personally viewed and interpreted the cardiac monitored which showed: Sinus rhythm with LAE, heart rate of 96 bpm.  Lab Tests  I ordered and personally interpreted labs.  The pertinent results include:   CMP, magnesium, and CBC are within normal limits - no acute electrolyte derangement, AKI, infection, or anemia. Urine shows no signs of infection Initial and repeat troponin both negative Negative pregnancy test   Imaging Studies  I ordered imaging studies including CT head  I independently visualized and interpreted imaging which showed: no acute intracranial abnormalities. I agree with the radiologist interpretation   Problem List / ED Course / Critical Interventions / Medication Management  Presyncope, numbness I ordered medications including: Meclizine for dizziness  Fluids for oral hydration Reevaluation of the patient after these medicines showed that the patient improved. I have reviewed the patients home medicines and have made adjustments as needed. Patient able to tolerate p.o.  She is also able to ambulate in the room independently.   Social Determinants of Health  Occupation   Test / Admission - Considered  Discussed findings with patient.  All questions were  answered. She is hemodynamically stable and safe for discharge home. Return precautions provided.       Final Clinical Impression(s) / ED Diagnoses Final diagnoses:  Pre-syncope  Paresthesias    Rx / DC Orders ED Discharge Orders          Ordered    meclizine (ANTIVERT) 25 MG tablet  3 times daily PRN        08/22/23 1600              Maxwell Marion, PA-C 08/22/23 1602    Virgina Norfolk, DO 08/25/23 [phone]

## 2023-08-22 NOTE — ED Notes (Signed)
Discharge instructions reviewed with patient. Patient verbalizes understanding, no further questions at this time. Medications/prescriptions and follow up information provided. No acute distress noted at time of departure.  

## 2023-08-22 NOTE — ED Triage Notes (Signed)
States was at work, went to the bathroom at 1000 and felt unsteady, like she was drunk and had numbness to lips, hands and legs. Felt dizzy like she was going to pass. Symptoms resolved. Ambulatory to triage, normal speech, no weakness noted. Negative stroke screen.

## 2023-08-22 NOTE — Discharge Instructions (Addendum)
As discussed, your labs and imaging are reassuring.  Make sure you are drinking plenty of fluids in the interim.  Follow-up with your PCP in the next 3 to 5 days for reevaluation of your symptoms.  Get help right away if: You pass out or faint. You have any of these symptoms: Fast or uneven heartbeats (palpitations). Pain in your chest, belly, or back. Shortness of breath. You have a seizure. You have a very bad headache. You are confused. You have trouble seeing. You are very weak. You have trouble walking. You are bleeding from your mouth or butt. You have black or tarry poop (stool).

## 2023-08-22 NOTE — ED Notes (Signed)
Pt tolerated oral rehydration without difficulty.

## 2023-08-22 NOTE — ED Notes (Signed)
Patient was given 480 mL of Gatorade Zero as ordered by the provider. The patient tolerated it well, and monitoring will continue.

## 2023-08-22 NOTE — ED Notes (Signed)
Pt in restroom, delay in triage
